# Patient Record
Sex: Female | Born: 1985
Health system: Southern US, Community
[De-identification: ages and names within clinical notes are randomized; demographics above are authoritative.]

---

## 2015-11-28 NOTE — L&D Delivery Note (Signed)
Delivery Note At 9:00 PM a viable female was delivered via  (Presentation: ;DOA  ). Tight nuchal cord clamped and cut on perineum.  APGAR: , ; weight  .   Placenta status: spontaneous/ intact, .  Cord: 3VC, marginal insertion  with the following complications: .  Cord pH: not indicated  Anesthesia:  epidural Episiotomy:  none Lacerations:  1st degree periurethral on L Suture Repair: n/a Est. Blood Loss (mL):  400  Mom to postpartum.  Baby to Couplet care / Skin to Skin.  Dashel Goines A. 03/29/2016, 9:12 PM

## 2015-12-09 DIAGNOSIS — Z3482 Encounter for supervision of other normal pregnancy, second trimester: Secondary | ICD-10-CM | POA: Diagnosis not present

## 2015-12-09 LAB — OB RESULTS CONSOLE ANTIBODY SCREEN: Antibody Screen: NEGATIVE

## 2015-12-09 LAB — OB RESULTS CONSOLE RUBELLA ANTIBODY, IGM: Rubella: IMMUNE

## 2015-12-09 LAB — OB RESULTS CONSOLE ABO/RH: RH Type: POSITIVE

## 2015-12-09 LAB — OB RESULTS CONSOLE HEPATITIS B SURFACE ANTIGEN: Hepatitis B Surface Ag: NEGATIVE

## 2015-12-09 LAB — OB RESULTS CONSOLE RPR: RPR: NONREACTIVE

## 2015-12-17 DIAGNOSIS — Z3A25 25 weeks gestation of pregnancy: Secondary | ICD-10-CM | POA: Diagnosis not present

## 2015-12-17 DIAGNOSIS — O09219 Supervision of pregnancy with history of pre-term labor, unspecified trimester: Secondary | ICD-10-CM | POA: Diagnosis not present

## 2015-12-17 DIAGNOSIS — Z113 Encounter for screening for infections with a predominantly sexual mode of transmission: Secondary | ICD-10-CM | POA: Diagnosis not present

## 2015-12-17 DIAGNOSIS — O358XX0 Maternal care for other (suspected) fetal abnormality and damage, not applicable or unspecified: Secondary | ICD-10-CM | POA: Diagnosis not present

## 2015-12-17 LAB — OB RESULTS CONSOLE GC/CHLAMYDIA
Chlamydia: NEGATIVE
Gonorrhea: NEGATIVE

## 2015-12-30 DIAGNOSIS — Z23 Encounter for immunization: Secondary | ICD-10-CM | POA: Diagnosis not present

## 2015-12-30 DIAGNOSIS — Z36 Encounter for antenatal screening of mother: Secondary | ICD-10-CM | POA: Diagnosis not present

## 2015-12-30 DIAGNOSIS — O09219 Supervision of pregnancy with history of pre-term labor, unspecified trimester: Secondary | ICD-10-CM | POA: Diagnosis not present

## 2015-12-30 LAB — OB RESULTS CONSOLE HIV ANTIBODY (ROUTINE TESTING): HIV: NONREACTIVE

## 2016-01-07 DIAGNOSIS — O09213 Supervision of pregnancy with history of pre-term labor, third trimester: Secondary | ICD-10-CM | POA: Diagnosis not present

## 2016-01-07 DIAGNOSIS — Z3A28 28 weeks gestation of pregnancy: Secondary | ICD-10-CM | POA: Diagnosis not present

## 2016-01-07 DIAGNOSIS — O09219 Supervision of pregnancy with history of pre-term labor, unspecified trimester: Secondary | ICD-10-CM | POA: Diagnosis not present

## 2016-02-28 DIAGNOSIS — O09219 Supervision of pregnancy with history of pre-term labor, unspecified trimester: Secondary | ICD-10-CM | POA: Diagnosis not present

## 2016-02-28 DIAGNOSIS — Z3A36 36 weeks gestation of pregnancy: Secondary | ICD-10-CM | POA: Diagnosis not present

## 2016-02-28 DIAGNOSIS — O09213 Supervision of pregnancy with history of pre-term labor, third trimester: Secondary | ICD-10-CM | POA: Diagnosis not present

## 2016-03-07 DIAGNOSIS — Z3A36 36 weeks gestation of pregnancy: Secondary | ICD-10-CM | POA: Diagnosis not present

## 2016-03-07 DIAGNOSIS — O09213 Supervision of pregnancy with history of pre-term labor, third trimester: Secondary | ICD-10-CM | POA: Diagnosis not present

## 2016-03-13 LAB — OB RESULTS CONSOLE GBS: GBS: NEGATIVE

## 2016-03-29 ENCOUNTER — Inpatient Hospital Stay (HOSPITAL_COMMUNITY)
Admission: AD | Admit: 2016-03-29 | Discharge: 2016-03-30 | DRG: 775 | Disposition: A | Discharge status: Home / Self Care | Payer: 59 | Source: Ambulatory Visit | Attending: Obstetrics | Admitting: Obstetrics

## 2016-03-29 ENCOUNTER — Encounter (HOSPITAL_COMMUNITY): Payer: Self-pay

## 2016-03-29 ENCOUNTER — Other Ambulatory Visit: Payer: Self-pay | Admitting: Obstetrics & Gynecology

## 2016-03-29 ENCOUNTER — Inpatient Hospital Stay (HOSPITAL_COMMUNITY): Payer: 59 | Admitting: Anesthesiology

## 2016-03-29 ENCOUNTER — Encounter (HOSPITAL_COMMUNITY): Payer: Self-pay | Admitting: *Deleted

## 2016-03-29 ENCOUNTER — Telehealth (HOSPITAL_COMMUNITY): Payer: Self-pay | Admitting: *Deleted

## 2016-03-29 DIAGNOSIS — O09213 Supervision of pregnancy with history of pre-term labor, third trimester: Secondary | ICD-10-CM | POA: Diagnosis not present

## 2016-03-29 DIAGNOSIS — Z23 Encounter for immunization: Secondary | ICD-10-CM | POA: Diagnosis not present

## 2016-03-29 DIAGNOSIS — IMO0001 Reserved for inherently not codable concepts without codable children: Secondary | ICD-10-CM

## 2016-03-29 DIAGNOSIS — Z3A40 40 weeks gestation of pregnancy: Secondary | ICD-10-CM | POA: Diagnosis not present

## 2016-03-29 LAB — CBC
HCT: 37.1 % (ref 36.0–46.0)
Hemoglobin: 12.8 g/dL (ref 12.0–15.0)
MCH: 30.3 pg (ref 26.0–34.0)
MCHC: 34.5 g/dL (ref 30.0–36.0)
MCV: 87.9 fL (ref 78.0–100.0)
Platelets: 254 10*3/uL (ref 150–400)
RBC: 4.22 MIL/uL (ref 3.87–5.11)
RDW: 14 % (ref 11.5–15.5)
WBC: 10.2 10*3/uL (ref 4.0–10.5)

## 2016-03-29 LAB — TYPE AND SCREEN
ABO/RH(D): AB POS
Antibody Screen: NEGATIVE

## 2016-03-29 LAB — ABO/RH: ABO/RH(D): AB POS

## 2016-03-29 MED ORDER — ONDANSETRON HCL 4 MG/2ML IJ SOLN: 4.0000 mg | Freq: Four times a day (QID) | INTRAMUSCULAR | Status: DC | PRN

## 2016-03-29 MED ORDER — LIDOCAINE HCL (PF) 1 % IJ SOLN
30.0000 mL | INTRAMUSCULAR | Status: DC | PRN
  Filled 2016-03-29: qty 30

## 2016-03-29 MED ORDER — ACETAMINOPHEN 325 MG PO TABS: 650.0000 mg | ORAL_TABLET | ORAL | Status: DC | PRN

## 2016-03-29 MED ORDER — OXYTOCIN BOLUS FROM INFUSION
500.0000 mL | INTRAVENOUS | Status: DC
  Administered 2016-03-29: 500 mL via INTRAVENOUS

## 2016-03-29 MED ORDER — LACTATED RINGERS IV SOLN
INTRAVENOUS | Status: DC
  Administered 2016-03-29: 20:00:00 via INTRAVENOUS
  Administered 2016-03-29: 125 mL/h via INTRAVENOUS

## 2016-03-29 MED ORDER — OXYCODONE-ACETAMINOPHEN 5-325 MG PO TABS: 2.0000 | ORAL_TABLET | ORAL | Status: DC | PRN

## 2016-03-29 MED ORDER — EPHEDRINE 5 MG/ML INJ
10.0000 mg | INTRAVENOUS | Status: DC | PRN
  Filled 2016-03-29: qty 2

## 2016-03-29 MED ORDER — PHENYLEPHRINE 40 MCG/ML (10ML) SYRINGE FOR IV PUSH (FOR BLOOD PRESSURE SUPPORT)
80.0000 ug | PREFILLED_SYRINGE | INTRAVENOUS | Status: DC | PRN
  Filled 2016-03-29: qty 5
  Filled 2016-03-29: qty 10

## 2016-03-29 MED ORDER — OXYTOCIN 10 UNIT/ML IJ SOLN
2.5000 [IU]/h | INTRAVENOUS | Status: DC
  Filled 2016-03-29: qty 4

## 2016-03-29 MED ORDER — FENTANYL 2.5 MCG/ML BUPIVACAINE 1/10 % EPIDURAL INFUSION (WH - ANES)
14.0000 mL/h | INTRAMUSCULAR | Status: DC | PRN
  Administered 2016-03-29: 14 mL/h via EPIDURAL
  Filled 2016-03-29: qty 125

## 2016-03-29 MED ORDER — LACTATED RINGERS IV SOLN: 500.0000 mL | Freq: Once | INTRAVENOUS | Status: DC

## 2016-03-29 MED ORDER — PHENYLEPHRINE 40 MCG/ML (10ML) SYRINGE FOR IV PUSH (FOR BLOOD PRESSURE SUPPORT)
80.0000 ug | PREFILLED_SYRINGE | INTRAVENOUS | Status: DC | PRN
  Filled 2016-03-29: qty 5

## 2016-03-29 MED ORDER — LIDOCAINE HCL (PF) 1 % IJ SOLN
INTRAMUSCULAR | Status: DC | PRN
  Administered 2016-03-29 (×2): 4 mL via EPIDURAL

## 2016-03-29 MED ORDER — DIPHENHYDRAMINE HCL 50 MG/ML IJ SOLN: 12.5000 mg | INTRAMUSCULAR | Status: DC | PRN

## 2016-03-29 MED ORDER — OXYCODONE-ACETAMINOPHEN 5-325 MG PO TABS: 1.0000 | ORAL_TABLET | ORAL | Status: DC | PRN

## 2016-03-29 MED ORDER — FLEET ENEMA 7-19 GM/118ML RE ENEM: 1.0000 | ENEMA | RECTAL | Status: DC | PRN

## 2016-03-29 MED ORDER — CITRIC ACID-SODIUM CITRATE 334-500 MG/5ML PO SOLN: 30.0000 mL | ORAL | Status: DC | PRN

## 2016-03-29 MED ORDER — LACTATED RINGERS IV SOLN
500.0000 mL | INTRAVENOUS | Status: DC | PRN
Start: 2016-03-29 — End: 2016-03-30
  Administered 2016-03-29 (×2): 500 mL via INTRAVENOUS

## 2016-03-29 NOTE — H&P (Signed)
Eual Finesia M Merlo is a 30 y.o. [id number] at 7447w3d presenting for active labor. Pt notes onset contractions after membranes stripped in office at 11 AM today . Good fetal movement, No vaginal bleeding, not leaking fluid.  [facility] at [facility] since 24 wks - Late transfer of care at 24 weeks after moving from [place] with intermittent care prior to that due to insurance issues -Dated by 10 week ultrasound - Declined genetic screening - History of preterm delivery at 27 weeks due to persistent subchorionic hemorrhage patient declined 17 hydroxyprogesterone in this pregnancy also history of 37 week spontaneous vaginal delivery 10 years ago - Fetal assessment: Growth at 40+ weeks on May 3: 9 pounds, 85th percentile, AFI 17   Prenatal Transfer Tool  Maternal Diabetes: No Genetic Screening: Declined Maternal Ultrasounds/Referrals: Normal Fetal Ultrasounds or other Referrals:  None Maternal Substance Abuse:  No Significant Maternal Medications:  None Significant Maternal Lab Results: None     OB History    Gravida Para Term Preterm AB TAB SAB Ectopic Multiple Living   3 2 1 1      2      History reviewed. No pertinent past medical history. History reviewed. No pertinent past surgical history. Family History: family history includes Cancer in her maternal grandmother and paternal grandmother; Hypertension in her mother. Social History:  reports that she has never smoked. She has never used smokeless tobacco. She reports that she does not drink alcohol or use illicit drugs.  Review of Systems - Negative except Contractions   Dilation: 7 Effacement (%): 80 Station: -1 Exam by:: Ernestina PennaFogleman, MD Blood pressure 112/61, pulse 85, temperature 97.4 F (36.3 C), temperature source Oral, resp. rate 18, height 5\' 3"  (1.6 m), weight 90.719 kg (200 lb), SpO2 100 %.  Physical Exam:  Gen: well appearing, no distress Back: no CVAT Abd: gravid, NT, no RUQ pain LE: No edema, equal bilaterally,  non-tender Toco: Every 3 minutes FH: baseline 135, accelerations present, no deceleratons, 10 beat variability  Prenatal labs: ABO, Rh: --/--/AB POS, AB POS (05/03 1750) Antibody: NEG (05/03 1750) Rubella: !Error! Immune RPR: Nonreactive (01/12 0000)  HBsAg: Negative (01/12 0000)  HIV:   negative GBS: Negative (04/17 0000)  1 hr Glucola 100  Genetic screening not done Anatomy US normal   Assessment/Plan: 30 y.o. [id number] at 2447w3d Patient presented in active labor. Expectant management. Planning epidural. History of vaginal delivery 2 this baby is significant only bigger than her prior pregnancies. GBS negative   Judine Arciniega A. 03/29/2016, 8:20 PM

## 2016-03-29 NOTE — Anesthesia Preprocedure Evaluation (Signed)
Anesthesia Evaluation  Patient identified by MRN, date of birth, ID band Patient awake    Reviewed: Allergy & Precautions, NPO status , Patient's Chart, lab work & pertinent test results  Airway Mallampati: II  TM Distance: >3 FB Neck ROM: Full    Dental no notable dental hx. (+) Teeth Intact   Pulmonary neg pulmonary ROS,    Pulmonary exam normal breath sounds clear to auscultation       Cardiovascular negative cardio ROS Normal cardiovascular exam Rhythm:Regular Rate:Normal     Neuro/Psych negative neurological ROS  negative psych ROS   GI/Hepatic negative GI ROS, Neg liver ROS,   Endo/Other  Obesity   Renal/GU negative Renal ROS  negative genitourinary   Musculoskeletal negative musculoskeletal ROS (+)   Abdominal (+) + obese,   Peds  Hematology negative hematology ROS (+)   Anesthesia Other Findings   Reproductive/Obstetrics (+) Pregnancy                             Anesthesia Physical Anesthesia Plan  ASA: II  Anesthesia Plan: Epidural   Post-op Pain Management:    Induction:   Airway Management Planned: Natural Airway  Additional Equipment:   Intra-op Plan:   Post-operative Plan: Extubation in OR  Informed Consent: I have reviewed the patients History and Physical, chart, labs and discussed the procedure including the risks, benefits and alternatives for the proposed anesthesia with the patient or authorized representative who has indicated his/her understanding and acceptance.   Dental advisory given  Plan Discussed with: Anesthesiologist  Anesthesia Plan Comments:         Anesthesia Quick Evaluation

## 2016-03-29 NOTE — MAU Note (Signed)
Patient presents with c/o ctx a min apart. Patient denies any LOF or bleeding. Fetus active

## 2016-03-29 NOTE — Progress Notes (Signed)
S: Doing well, no complaints, pain controlled with epidural though feeling increased pressure  O: BP 112/61 mmHg  Pulse 85  Temp(Src) 97.4 F (36.3 C) (Oral)  Resp 18  Ht 5\' 3"  (1.6 m)  Wt 90.719 kg (200 lb)  BMI 35.44 kg/m2  SpO2 100%   FHT:  FHR: 140s bpm, variability: moderate,  accelerations:  Present,  decelerations:  Absent UC:   regular, every 3 minutes SVE:   Dilation: 9 Effacement (%): 100 Station: -1 Exam by:: Doloris HallJenny Middleton RN   Exam by me: 7/80/-2 SROM- thin meconium   A / P:  30 y.o.  Obstetric History   G3   P2   T1   P1   A0   TAB0   SAB0   E0   M0   L2    at 5677w3d Active labor. Expectant management. Reactive fetus, Epidural  Fetal Wellbeing:  Category I Pain Control:  Epidural  Anticipated MOD:  NSVD  Jakari Sada A. 03/29/2016, 7:50 PM

## 2016-03-29 NOTE — Anesthesia Procedure Notes (Signed)
Epidural Patient location during procedure: OB Start time: 03/29/2016 6:32 PM  Staffing Anesthesiologist: Mal AmabileFOSTER, Tremel Setters Performed by: anesthesiologist   Preanesthetic Checklist Completed: patient identified, site marked, surgical consent, pre-op evaluation, timeout performed, IV checked, risks and benefits discussed and monitors and equipment checked  Epidural Patient position: sitting Prep: site prepped and draped and DuraPrep Patient monitoring: continuous pulse ox and blood pressure Approach: midline Location: L3-L4 Injection technique: LOR air  Needle:  Needle type: Tuohy  Needle gauge: 17 G Needle length: 9 cm and 9 Needle insertion depth: 7 cm Catheter type: closed end flexible Catheter size: 19 Gauge Catheter at skin depth: 12 cm Test dose: negative and Other  Assessment Events: blood not aspirated, injection not painful, no injection resistance, negative IV test and no paresthesia  Additional Notes Patient identified. Risks and benefits discussed including failed block, incomplete  Pain control, post dural puncture headache, nerve damage, paralysis, blood pressure Changes, nausea, vomiting, reactions to medications-both toxic and allergic and post Partum back pain. All questions were answered. Patient expressed understanding and wished to proceed. Sterile technique was used throughout procedure. Epidural site was Dressed with sterile barrier dressing. No paresthesias, signs of intravascular injection Or signs of intrathecal spread were encountered.  Patient was more comfortable after the epidural was dosed. Please see RN's note for documentation of vital signs and FHR which are stable.

## 2016-03-29 NOTE — Telephone Encounter (Signed)
Preadmission screen  

## 2016-03-30 ENCOUNTER — Encounter (HOSPITAL_COMMUNITY): Payer: Self-pay | Admitting: Obstetrics and Gynecology

## 2016-03-30 LAB — RPR: RPR Ser Ql: NONREACTIVE

## 2016-03-30 MED ORDER — WITCH HAZEL-GLYCERIN EX PADS: 1.0000 "application " | MEDICATED_PAD | CUTANEOUS | Status: DC | PRN

## 2016-03-30 MED ORDER — DIBUCAINE 1 % RE OINT: 1.0000 "application " | TOPICAL_OINTMENT | RECTAL | Status: DC | PRN

## 2016-03-30 MED ORDER — DIPHENHYDRAMINE HCL 25 MG PO CAPS: 25.0000 mg | ORAL_CAPSULE | Freq: Four times a day (QID) | ORAL | Status: DC | PRN

## 2016-03-30 MED ORDER — SIMETHICONE 80 MG PO CHEW: 80.0000 mg | CHEWABLE_TABLET | ORAL | Status: DC | PRN

## 2016-03-30 MED ORDER — BENZOCAINE-MENTHOL 20-0.5 % EX AERO: 1.0000 "application " | INHALATION_SPRAY | CUTANEOUS | Status: DC | PRN

## 2016-03-30 MED ORDER — COCONUT OIL OIL: 1.0000 "application " | TOPICAL_OIL | Status: DC | PRN

## 2016-03-30 MED ORDER — ONDANSETRON HCL 4 MG PO TABS: 4.0000 mg | ORAL_TABLET | ORAL | Status: DC | PRN

## 2016-03-30 MED ORDER — IBUPROFEN 600 MG PO TABS: 600.0000 mg | ORAL_TABLET | Freq: Four times a day (QID) | ORAL | Status: DC

## 2016-03-30 MED ORDER — IBUPROFEN 600 MG PO TABS
600.0000 mg | ORAL_TABLET | Freq: Four times a day (QID) | ORAL | Status: DC
  Administered 2016-03-30 (×3): 600 mg via ORAL
  Filled 2016-03-30 (×3): qty 1

## 2016-03-30 MED ORDER — ZOLPIDEM TARTRATE 5 MG PO TABS: 5.0000 mg | ORAL_TABLET | Freq: Every evening | ORAL | Status: DC | PRN

## 2016-03-30 MED ORDER — SENNOSIDES-DOCUSATE SODIUM 8.6-50 MG PO TABS
2.0000 | ORAL_TABLET | ORAL | Status: DC
  Administered 2016-03-30: 2 via ORAL
  Filled 2016-03-30: qty 2

## 2016-03-30 MED ORDER — PRENATAL MULTIVITAMIN CH
1.0000 | ORAL_TABLET | Freq: Every day | ORAL | Status: DC
  Administered 2016-03-30: 1 via ORAL
  Filled 2016-03-30: qty 1

## 2016-03-30 MED ORDER — ACETAMINOPHEN 325 MG PO TABS: 650.0000 mg | ORAL_TABLET | ORAL | Status: DC | PRN

## 2016-03-30 MED ORDER — TETANUS-DIPHTH-ACELL PERTUSSIS 5-2.5-18.5 LF-MCG/0.5 IM SUSP
0.5000 mL | Freq: Once | INTRAMUSCULAR | Status: DC
Start: 2016-03-30 — End: 2016-03-30

## 2016-03-30 MED ORDER — ONDANSETRON HCL 4 MG/2ML IJ SOLN: 4.0000 mg | INTRAMUSCULAR | Status: DC | PRN

## 2016-03-30 NOTE — Discharge Summary (Signed)
OB Discharge Summary     Patient Name: Sherri Logan DOB: 1986/05/16 MRN: [id number]  Date of admission: 03/29/2016 Delivering MD: Noland Fordyce   Date of discharge: 03/30/2016  Admitting diagnosis: 40.3d labor Intrauterine pregnancy: [redacted]w[redacted]d     Secondary diagnosis:  Principal Problem:   Postpartum care following vaginal delivery (5/3) Active Problems:   Active labor at term  Additional problems: none     Discharge diagnosis: Term Pregnancy Delivered                                                                                                Post partum procedures:none  Augmentation: none  Complications: None  Hospital course:  Onset of Labor With Vaginal Delivery     30 y.o. yo [id number] at [redacted]w[redacted]d was admitted in Active Labor on 03/29/2016. Patient had an uncomplicated labor course as follows:  Membrane Rupture Time/Date: 7:42 PM ,03/29/2016   Intrapartum Procedures: Episiotomy: None [1]                                         Lacerations:  None [1]  Patient had a delivery of a Viable infant. 03/29/2016  Information for the patient's newborn:  Chelbie, Jarnagin Girl Selinda [[id number]]  Delivery Method: Vaginal, Spontaneous Delivery (Filed from Delivery Summary)    Pateint had an uncomplicated postpartum course.  She is ambulating, tolerating a regular diet, passing flatus, and urinating well. Patient is discharged home in stable condition on 03/30/2016.    Physical exam  Filed Vitals:   03/29/16 2216 03/29/16 2303 03/30/16 0047 03/30/16 0415  BP: 124/71 96/77 120/68 107/67  Pulse: 91 83 79 75  Temp:  98.3 F (36.8 C) 97.9 F (36.6 C) 97.7 F (36.5 C)  TempSrc:      Resp: 16     Height:      Weight:      SpO2:    99%   General: alert, cooperative and no distress Lochia: appropriate Uterine Fundus: firm, midline, U-1 DVT Evaluation: No evidence of DVT seen on physical exam. Negative Homan's sign. No cords or calf tenderness. No significant calf/ankle edema. Labs: Lab Results   Component Value Date   WBC 10.2 03/29/2016   HGB 12.8 03/29/2016   HCT 37.1 03/29/2016   MCV 87.9 03/29/2016   PLT 254 03/29/2016   No flowsheet data found.  Discharge instruction: per After Visit Summary and "Baby and Me Booklet".  After visit meds:    Medication List    TAKE these medications        ibuprofen 600 MG tablet  Commonly known as:  ADVIL,MOTRIN  Take 1 tablet (600 mg total) by mouth every 6 (six) hours.     prenatal multivitamin Tabs tablet  Take 1 tablet by mouth daily at 12 noon.        Diet: routine diet  Activity: Advance as tolerated. Pelvic rest for 6 weeks.   Outpatient follow up:6 weeks Follow up Appt:No future appointments. Follow up Visit:No Follow-up on  file.  Postpartum contraception: Undecided  Newborn Data: Live born female  Birth Weight: 7 lb 15 oz (3600 g) APGAR: 9, 9  Baby Feeding: Breast Disposition:home with mother   03/30/2016 Raelyn MoraAWSON, Khylee Algeo, Judie PetitM, CNM

## 2016-03-30 NOTE — Anesthesia Postprocedure Evaluation (Signed)
Anesthesia Post Note  Patient: Sherri Logan  Procedure(s) Performed: * No procedures listed *  Patient location during evaluation: Mother Baby Anesthesia Type: Epidural Level of consciousness: awake and alert Pain management: satisfactory to patient Vital Signs Assessment: post-procedure vital signs reviewed and stable Respiratory status: respiratory function stable Cardiovascular status: stable Postop Assessment: no headache, no backache, epidural receding, patient able to bend at knees, no signs of nausea or vomiting and adequate PO intake Anesthetic complications: no Comments: Comfort level was assessed by AnesthesiaTeam and the patient was pleased with the care, interventions, and services provided by the Department of Anesthesia.     Last Vitals:  Filed Vitals:   03/30/16 0047 03/30/16 0415  BP: 120/68 107/67  Pulse: 79 75  Temp: 36.6 C 36.5 C  Resp:      Last Pain:  Filed Vitals:   03/30/16 0539  PainSc: 0-No pain   Pain Goal: Patients Stated Pain Goal: 0 (03/29/16 1718)               Aiyla Baucom

## 2016-03-30 NOTE — Progress Notes (Signed)
Patient ID: Sherri Logan, female   DOB: 1985/12/14, 30 y.o.   MRN: [id number] PPD # 1 SVD  S:  Reports feeling well. Desires an early discharge home.             Tolerating po/ No nausea or vomiting             Bleeding is light             Pain controlled with ibuprofen (OTC)             Up ad lib / ambulatory / voiding without difficulties    Newborn  Information for the patient's newborn:  Sherri Logan, Sherri Sherri Logan [[id number]][[id number]]  female  breast feeding    O:  A & O x 3, in no apparent distress              VS:  Filed Vitals:   03/29/16 2216 03/29/16 2303 03/30/16 0047 03/30/16 0415  BP: 124/71 96/77 120/68 107/67  Pulse: 91 83 79 75  Temp:  98.3 F (36.8 C) 97.9 F (36.6 C) 97.7 F (36.5 C)  TempSrc:      Resp: 16     Height:      Weight:      SpO2:    99%    LABS:  Recent Labs  03/29/16 1750  WBC 10.2  HGB 12.8  HCT 37.1  PLT 254    Blood type: AB POS (05/03 1750)  Rubella: Immune (01/12 0000)   I&O: I/O last 3 completed shifts: In: -  Out: 700 [Urine:650; Blood:50]             Lungs: Clear and unlabored  Heart: regular rate and rhythm / no murmurs  Abdomen: soft, non-tender, non-distended             Fundus: firm, non-tender, U-1  Perineum: 1st degree repair healing well  Lochia: minimal  Extremities: No edema, no calf pain or tenderness, No Homans    A/P: PPD # 1  30 y.o., [id number]   Principal Problem:    Postpartum care following vaginal delivery (5/3)     Doing well - stable status  Routine post partum orders  Early discharge home today    Parvin Stetzer, M, MSN, CNM 03/30/2016, 8:19 AM

## 2016-03-30 NOTE — Lactation Note (Signed)
This note was copied from a baby's chart. Lactation Consultation Note Experienced BF mom BF her now 30 yr old for 17 months until she became pregnant with this baby. Mom states this baby is wanting to stay on the breast. Mom has large pendulum breast. Moms nipples appear flat until stimulated, erects well, breast very compressable. Mom demonstrated hand expression w/glistening of colostrum. Mom encouraged to feed baby 8-12 times/24 hours and with feeding cues. Referred to Baby and Me Book in Breastfeeding section Pg. 22-23 for position options and Proper latch demonstration. WH/LC brochure given w/resources, support groups and LC services. Patient Name: Sherri Logan [id number]'WToday's Date: 03/30/2016 Reason for consult: Initial assessment   Maternal Data Has patient been taught Hand Expression?: Yes Does the patient have breastfeeding experience prior to this delivery?: Yes  Feeding Feeding Type: Breast Fed Length of feed: 45 min  LATCH Score/Interventions Latch: Grasps breast easily, tongue down, lips flanged, rhythmical sucking.  Audible Swallowing: None  Type of Nipple: Everted at rest and after stimulation  Comfort (Breast/Nipple): Soft / non-tender     Hold (Positioning): No assistance needed to correctly position infant at breast. Intervention(s): Breastfeeding basics reviewed;Support Pillows;Position options;Skin to skin  LATCH Score: 8  Lactation Tools Discussed/Used WIC Program: No   Consult Status Consult Status: Follow-up Date: 03/31/16 Follow-up type: In-patient    Charyl DancerCARVER, Janat Tabbert G 03/30/2016, 3:51 AM

## 2016-03-30 NOTE — Discharge Instructions (Signed)
Breast Pumping Tips °If you are breastfeeding, there may be times when you cannot feed your baby directly. Returning to work or going on a trip are common examples. Pumping allows you to store breast milk and feed it to your baby later.  °You may not get much milk when you first start to pump. Your breasts should start to make more after a few days. If you pump at the times you usually feed your baby, you may be able to keep making enough milk to feed your baby without also using formula. The more often you pump, the more milk you will produce.  °WHEN SHOULD I PUMP?  °· You can begin to pump soon after delivery. However, some experts recommend waiting about 4 weeks before giving your infant a bottle to make sure breastfeeding is going well.  °· If you plan to return to work, begin pumping a few weeks before. This will help you develop techniques that work best for you. It also lets you build up a supply of breast milk.   °· When you are with your infant, feed on demand and pump after each feeding.   °· When you are away from your infant for several hours, pump for about 15 minutes every 2-3 hours. Pump both breasts at the same time if you can.   °· If your infant has a formula feeding, make sure to pump around the same time.     °· If you drink any alcohol, wait 2 hours before pumping.   °HOW DO I PREPARE TO PUMP? °Your let-down reflex is the natural reaction to stimulation that makes your breast milk flow. It is easier to stimulate this reflex when you are relaxed. Find relaxation techniques that work for you. If you have difficulty with your let-down reflex, try these methods:  °· Smell one of your infant's blankets or an item of clothing.   °· Look at a picture or video of your infant.   °· Sit in a quiet, private space.   °· Massage the breast you plan to pump.   °· Place soothing warmth on the breast.   °· Play relaxing music.   °WHAT ARE SOME GENERAL BREAST PUMPING TIPS? °· Wash your hands before you pump. You  do not need to wash your nipples or breasts. °· There are three ways to pump. °¨ You can use your hand to massage and compress your breast. °¨ You can use a handheld manual pump. °¨ You can use an electric pump.   °· Make sure the suction cup (flange) on the breast pump is the right size. Place the flange directly over the nipple. If it is the wrong size or placed the wrong way, it may be painful and cause nipple damage.   °· If pumping is uncomfortable, apply a small amount of purified or modified lanolin to your nipple and areola. °· If you are using an electric pump, adjust the speed and suction power to be more comfortable. °· If pumping is painful or if you are not getting very much milk, you may need a different type of pump. A lactation consultant can help you determine what type of pump to use.   °· Keep a full water bottle near you at all times. Drinking lots of fluid helps you make more milk.  °· You can store your milk to use later. Pumped breast milk can be stored in a sealable, sterile container or plastic bag. Label all stored breast milk with the date you pumped it. °¨ Milk can stay out at room temperature for up to 8 hours. °¨   You can store your milk in the refrigerator for up to 8 days. °¨ You can store your milk in the freezer for 3 months. Thaw frozen milk using warm water. Do not put it in the microwave. °· Do not smoke. Smoking can lower your milk supply and harm your infant. If you need help quitting, ask your health care provider to recommend a program.   °WHEN SHOULD I CALL MY HEALTH CARE PROVIDER OR A LACTATION CONSULTANT? °· You are having trouble pumping. °· You are concerned that you are not making enough milk. °· You have nipple pain, soreness, or redness. °· You want to use birth control. Birth control pills may lower your milk supply. Talk to your health care provider about your options. °  °This information is not intended to replace advice given to you by your health care provider.  Make sure you discuss any questions you have with your health care provider. °  °Document Released: 05/03/2010 Document Revised: 11/18/2013 Document Reviewed: 09/05/2013 °[employer]vier Interactive Patient Education ©2016 [employer] °Postpartum Depression and Baby Blues °The postpartum period begins right after the birth of a baby. During this time, there is often a great amount of joy and excitement. It is also a time of many changes in the life of the parents. Regardless of how many times a mother gives birth, each child brings new challenges and dynamics to the family. It is not unusual to have feelings of excitement along with confusing shifts in moods, emotions, and thoughts. All mothers are at risk of developing postpartum depression or the "baby blues." These mood changes can occur right after giving birth, or they may occur many months after giving birth. The baby blues or postpartum depression can be mild or severe. Additionally, postpartum depression can go away rather quickly, or it can be a long-term condition.  °CAUSES °Raised hormone levels and the rapid drop in those levels are thought to be a main cause of postpartum depression and the baby blues. A number of hormones change during and after pregnancy. Estrogen and progesterone usually decrease right after the delivery of your baby. The levels of thyroid hormone and various cortisol steroids also rapidly drop. Other factors that play a role in these mood changes include major life events and genetics.  °RISK FACTORS °If you have any of the following risks for the baby blues or postpartum depression, know what symptoms to watch out for during the postpartum period. Risk factors that may increase the likelihood of getting the baby blues or postpartum depression include: °· Having a personal or family history of depression.   °· Having depression while being pregnant.   °· Having premenstrual mood issues or mood issues related to oral  contraceptives. °· Having a lot of life stress.   °· Having marital conflict.   °· Lacking a social support network.   °· Having a baby with special needs.   °· Having health problems, such as diabetes.   °SIGNS AND SYMPTOMS °Symptoms of baby blues include: °· Brief changes in mood, such as going from extreme happiness to sadness. °· Decreased concentration.   °· Difficulty sleeping.   °· Crying spells, tearfulness.   °· Irritability.   °· Anxiety.   °Symptoms of postpartum depression typically begin within the first month after giving birth. These symptoms include: °· Difficulty sleeping or excessive sleepiness.   °· Marked weight loss.   °· Agitation.   °· Feelings of worthlessness.   °· Lack of interest in activity or food.   °Postpartum psychosis is a very serious condition and can be dangerous. Fortunately, it is   rare. Displaying any of the following symptoms is cause for immediate medical attention. Symptoms of postpartum psychosis include:  °· Hallucinations and delusions.   °· Bizarre or disorganized behavior.   °· Confusion or disorientation.   °DIAGNOSIS  °A diagnosis is made by an evaluation of your symptoms. There are no medical or lab tests that lead to a diagnosis, but there are various questionnaires that a health care provider may use to identify those with the baby blues, postpartum depression, or psychosis. Often, a screening tool called the Edinburgh Postnatal Depression Scale is used to diagnose depression in the postpartum period.  °TREATMENT °The baby blues usually goes away on its own in 1-2 weeks. Social support is often all that is needed. You will be encouraged to get adequate sleep and rest. Occasionally, you may be given medicines to help you sleep.  °Postpartum depression requires treatment because it can last several months or longer if it is not treated. Treatment may include individual or group therapy, medicine, or both to address any social, physiological, and psychological factors  that may play a role in the depression. Regular exercise, a healthy diet, rest, and social support may also be strongly recommended.  °Postpartum psychosis is more serious and needs treatment right away. Hospitalization is often needed. °HOME CARE INSTRUCTIONS °· Get as much rest as you can. Nap when the baby sleeps.   °· Exercise regularly. Some women find yoga and walking to be beneficial.   °· Eat a balanced and nourishing diet.   °· Do little things that you enjoy. Have a cup of tea, take a bubble bath, read your favorite magazine, or listen to your favorite music. °· Avoid alcohol.   °· Ask for help with household chores, cooking, grocery shopping, or running errands as needed. Do not try to do everything.   °· Talk to people close to you about how you are feeling. Get support from your partner, family members, friends, or other new moms. °· Try to stay positive in how you think. Think about the things you are grateful for.   °· Do not spend a lot of time alone.   °· Only take over-the-counter or prescription medicine as directed by your health care provider. °· Keep all your postpartum appointments.   °· Let your health care provider know if you have any concerns.   °SEEK MEDICAL CARE IF: °You are having a reaction to or problems with your medicine. °SEEK IMMEDIATE MEDICAL CARE IF: °· You have suicidal feelings.   °· You think you may harm the baby or someone else. °MAKE SURE YOU: °· Understand these instructions. °· Will watch your condition. °· Will get help right away if you are not doing well or get worse. °  °This information is not intended to replace advice given to you by your health care provider. Make sure you discuss any questions you have with your health care provider. °  °Document Released: 08/17/2004 Document Revised: 11/18/2013 Document Reviewed: 08/25/2013 °Elsevier Interactive Patient Education ©2016 [employer] °Postpartum Care After Vaginal Delivery °After you deliver your newborn  (postpartum period), the usual stay in the hospital is 24-72 hours. If there were problems with your labor or delivery, or if you have other medical problems, you might be in the hospital longer.  °While you are in the hospital, you will receive help and instructions on how to care for yourself and your newborn during the postpartum period.  °While you are in the hospital: °· Be sure to tell your nurses if you have pain or discomfort, as well as   where you feel the pain and what makes the pain worse. °· If you had an incision made near your vagina (episiotomy) or if you had some tearing during delivery, the nurses may put ice packs on your episiotomy or tear. The ice packs may help to reduce the pain and swelling. °· If you are breastfeeding, you may feel uncomfortable contractions of your uterus for a couple of weeks. This is normal. The contractions help your uterus get back to normal size. °· It is normal to have some bleeding after delivery. °¨ For the first 1-3 days after delivery, the flow is red and the amount may be similar to a period. °¨ It is common for the flow to start and stop. °¨ In the first few days, you may pass some small clots. Let your nurses know if you begin to pass large clots or your flow increases. °¨ Do not  flush blood clots down the toilet before having the nurse look at them. °¨ During the next 3-10 days after delivery, your flow should become more watery and pink or brown-tinged in color. °¨ Ten to fourteen days after delivery, your flow should be a small amount of yellowish-white discharge. °¨ The amount of your flow will decrease over the first few weeks after delivery. Your flow may stop in 6-8 weeks. Most women have had their flow stop by 12 weeks after delivery. °· You should change your sanitary pads frequently. °· Wash your hands thoroughly with soap and water for at least 20 seconds after changing pads, using the toilet, or before holding or feeding your newborn. °· You should  feel like you need to empty your bladder within the first 6-8 hours after delivery. °· In case you become weak, lightheaded, or faint, call your nurse before you get out of bed for the first time and before you take a shower for the first time. °· Within the first few days after delivery, your breasts may begin to feel tender and full. This is called engorgement. Breast tenderness usually goes away within 48-72 hours after engorgement occurs. You may also notice milk leaking from your breasts. If you are not breastfeeding, do not stimulate your breasts. Breast stimulation can make your breasts produce more milk. °· Spending as much time as possible with your newborn is very important. During this time, you and your newborn can feel close and get to know each other. Having your newborn stay in your room (rooming in) will help to strengthen the bond with your newborn.  It will give you time to get to know your newborn and become comfortable caring for your newborn. °· Your hormones change after delivery. Sometimes the hormone changes can temporarily cause you to feel sad or tearful. These feelings should not last more than a few days. If these feelings last longer than that, you should talk to your caregiver. °· If desired, talk to your caregiver about methods of family planning or contraception. °· Talk to your caregiver about immunizations. Your caregiver may want you to have the following immunizations before leaving the hospital: °¨ Tetanus, diphtheria, and pertussis (Tdap) or tetanus and diphtheria (Td) immunization. It is very important that you and your family (including grandparents) or others caring for your newborn are up-to-date with the Tdap or Td immunizations. The Tdap or Td immunization can help protect your newborn from getting ill. °¨ Rubella immunization. °¨ Varicella (chickenpox) immunization. °¨ Influenza immunization. You should receive this annual immunization if you did not receive the    immunization during your pregnancy. °  °This information is not intended to replace advice given to you by your health care provider. Make sure you discuss any questions you have with your health care provider. °  °Document Released: 09/10/2007 Document Revised: 08/07/2012 Document Reviewed: 07/10/2012 °[employer]vier Interactive Patient Education ©2016 [employer] °Breastfeeding and Mastitis °Mastitis is inflammation of the breast tissue. It can occur in women who are breastfeeding. This can make breastfeeding painful. Mastitis will sometimes go away on its own. Your health care provider will help determine if treatment is needed. °CAUSES °Mastitis is often associated with a blocked milk (lactiferous) duct. This can happen when too much milk builds up in the breast. Causes of excess milk in the breast can include: °· Poor latch-on. If your baby is not latched onto the breast properly, she or he may not empty your breast completely while breastfeeding. °· Allowing too much time to pass between feedings. °· Wearing a bra or other clothing that is too tight. This puts extra pressure on the lactiferous ducts so milk does not flow through them as it should. °Mastitis can also be caused by a bacterial infection. Bacteria may enter the breast tissue through cuts or openings in the skin. In women who are breastfeeding, this may occur because of cracked or irritated skin. Cracks in the skin are often caused when your baby does not latch on properly to the breast. °SIGNS AND SYMPTOMS °· Swelling, redness, tenderness, and pain in an area of the breast. °· Swelling of the glands under the arm on the same side. °· Fever may or may not accompany mastitis. °If an infection is allowed to progress, a collection of pus (abscess) may develop. °DIAGNOSIS  °Your health care provider can usually diagnose mastitis based on your symptoms and a physical exam. Tests may be done to help confirm the diagnosis. These may include: °· Removal of pus  from the breast by applying pressure to the area. This pus can be examined in the lab to determine which bacteria are present. If an abscess has developed, the fluid in the abscess can be removed with a needle. This can also be used to confirm the diagnosis and determine the bacteria present. In most cases, pus will not be present. °· Blood tests to determine if your body is fighting a bacterial infection. °· Mammogram or ultrasound tests to rule out other problems or diseases. °TREATMENT  °Mastitis that occurs with breastfeeding will sometimes go away on its own. Your health care provider may choose to wait 24 hours after first seeing you to decide whether a prescription medicine is needed. If your symptoms are worse after 24 hours, your health care provider will likely prescribe an antibiotic medicine to treat the mastitis. He or she will determine which bacteria are most likely causing the infection and will then select an appropriate antibiotic medicine. This is sometimes changed based on the results of tests performed to identify the bacteria, or if there is no response to the antibiotic medicine selected. Antibiotic medicines are usually given by mouth. You may also be given medicine for pain. °HOME CARE INSTRUCTIONS °· Only take over-the-counter or prescription medicines for pain, fever, or discomfort as directed by your health care provider. °· If your health care provider prescribed an antibiotic medicine, take the medicine as directed. Make sure you finish it even if you start to feel better. °· Do not wear a tight or underwire bra. Wear a soft, supportive bra. °· Increase your fluid   intake, especially if you have a fever. °· Continue to empty the breast. Your health care provider can tell you whether this milk is safe for your infant or needs to be thrown out. You may be told to stop nursing until your health care provider thinks it is safe for your baby. Use a breast pump if you are advised to stop  nursing. °· Keep your nipples clean and dry. °· Empty the first breast completely before going to the other breast. If your baby is not emptying your breasts completely for some reason, use a breast pump to empty your breasts. °· If you go back to work, pump your breasts while at work to stay in time with your nursing schedule. °· Avoid allowing your breasts to become overly filled with milk (engorged). °SEEK MEDICAL CARE IF: °· You have pus-like discharge from the breast. °· Your symptoms do not improve with the treatment prescribed by your health care provider within 2 days. °SEEK IMMEDIATE MEDICAL CARE IF: °· Your pain and swelling are getting worse. °· You have pain that is not controlled with medicine. °· You have a red line extending from the breast toward your armpit. °· You have a fever or persistent symptoms for more than 2-3 days. °· You have a fever and your symptoms suddenly get worse. °MAKE SURE YOU:  °· Understand these instructions. °· Will watch your condition. °· Will get help right away if you are not doing well or get worse. °  °This information is not intended to replace advice given to you by your health care provider. Make sure you discuss any questions you have with your health care provider. °  °Document Released: 03/10/2005 Document Revised: 11/18/2013 Document Reviewed: 06/19/2013 °[employer]vier Interactive Patient Education ©2016 [employer] ° °Breastfeeding °Deciding to breastfeed is one of the best choices you can make for you and your baby. A change in hormones during pregnancy causes your breast tissue to grow and increases the number and size of your milk ducts. These hormones also allow proteins, sugars, and fats from your blood supply to make breast milk in your milk-producing glands. Hormones prevent breast milk from being released before your baby is born as well as prompt milk flow after birth. Once breastfeeding has begun, thoughts of your baby, as well as his or her sucking or  crying, can stimulate the release of milk from your milk-producing glands.  °BENEFITS OF BREASTFEEDING °For Your Baby °· Your first milk (colostrum) helps your baby's digestive system function better. °· There are antibodies in your milk that help your baby fight off infections. °· Your baby has a lower incidence of asthma, allergies, and sudden infant death syndrome. °· The nutrients in breast milk are better for your baby than infant formulas and are designed uniquely for your baby's needs. °· Breast milk improves your baby's brain development. °· Your baby is less likely to develop other conditions, such as childhood obesity, asthma, or type 2 diabetes mellitus. °For You °· Breastfeeding helps to create a very special bond between you and your baby. °· Breastfeeding is convenient. Breast milk is always available at the correct temperature and costs nothing. °· Breastfeeding helps to burn calories and helps you lose the weight gained during pregnancy. °· Breastfeeding makes your uterus contract to its prepregnancy size faster and slows bleeding (lochia) after you give birth.   °· Breastfeeding helps to lower your risk of developing type 2 diabetes mellitus, osteoporosis, and breast or ovarian cancer later in life. °  SIGNS THAT YOUR BABY IS HUNGRY °Early Signs of Hunger °· Increased alertness or activity. °· Stretching. °· Movement of the head from side to side. °· Movement of the head and opening of the mouth when the corner of the mouth or cheek is stroked (rooting). °· Increased sucking sounds, smacking lips, cooing, sighing, or squeaking. °· Hand-to-mouth movements. °· Increased sucking of fingers or hands. °Late Signs of Hunger °· Fussing. °· Intermittent crying. °Extreme Signs of Hunger °Signs of extreme hunger will require calming and consoling before your baby will be able to breastfeed successfully. Do not wait for the following signs of extreme hunger to occur before you initiate  breastfeeding: °· Restlessness. °· A loud, strong cry. °· Screaming. °BREASTFEEDING BASICS °Breastfeeding Initiation °· Find a comfortable place to sit or lie down, with your neck and back well supported. °· Place a pillow or rolled up blanket under your baby to bring him or her to the level of your breast (if you are seated). Nursing pillows are specially designed to help support your arms and your baby while you breastfeed. °· Make sure that your baby's abdomen is facing your abdomen. °· Gently massage your breast. With your fingertips, massage from your chest wall toward your nipple in a circular motion. This encourages milk flow. You may need to continue this action during the feeding if your milk flows slowly. °· Support your breast with 4 fingers underneath and your thumb above your nipple. Make sure your fingers are well away from your nipple and your baby's mouth. °· Stroke your baby's lips gently with your finger or nipple. °· When your baby's mouth is open wide enough, quickly bring your baby to your breast, placing your entire nipple and as much of the colored area around your nipple (areola) as possible into your baby's mouth. °¨ More areola should be visible above your baby's upper lip than below the lower lip. °¨ Your baby's tongue should be between his or her lower gum and your breast. °· Ensure that your baby's mouth is correctly positioned around your nipple (latched). Your baby's lips should create a seal on your breast and be turned out (everted). °· It is common for your baby to suck about 2-3 minutes in order to start the flow of breast milk. °Latching °Teaching your baby how to latch on to your breast properly is very important. An improper latch can cause nipple pain and decreased milk supply for you and poor weight gain in your baby. Also, if your baby is not latched onto your nipple properly, he or she may swallow some air during feeding. This can make your baby fussy. Burping your baby when  you switch breasts during the feeding can help to get rid of the air. However, teaching your baby to latch on properly is still the best way to prevent fussiness from swallowing air while breastfeeding. °Signs that your baby has successfully latched on to your nipple: °· Silent tugging or silent sucking, without causing you pain. °· Swallowing heard between every 3-4 sucks. °· Muscle movement above and in front of his or her ears while sucking. °Signs that your baby has not successfully latched on to nipple: °· Sucking sounds or smacking sounds from your baby while breastfeeding. °· Nipple pain. °If you think your baby has not latched on correctly, slip your finger into the corner of your baby's mouth to break the suction and place it between your baby's gums. Attempt breastfeeding initiation again. °Signs of Successful Breastfeeding °  Signs from your baby: °· A gradual decrease in the number of sucks or complete cessation of sucking. °· Falling asleep. °· Relaxation of his or her body. °· Retention of a small amount of milk in his or her mouth. °· Letting go of your breast by himself or herself. °Signs from you: °· Breasts that have increased in firmness, weight, and size 1-3 hours after feeding. °· Breasts that are softer immediately after breastfeeding. °· Increased milk volume, as well as a change in milk consistency and color by the fifth day of breastfeeding. °· Nipples that are not sore, cracked, or bleeding. °Signs That Your Baby is Getting Enough Milk °· Wetting at least 3 diapers in a 24-hour period. The urine should be clear and pale yellow by age 5 days. °· At least 3 stools in a 24-hour period by age 5 days. The stool should be soft and yellow. °· At least 3 stools in a 24-hour period by age 7 days. The stool should be seedy and yellow. °· No loss of weight greater than 10% of birth weight during the first 3 days of age. °· Average weight gain of 4-7 ounces (113-198 g) per week after age 4  days. °· Consistent daily weight gain by age 5 days, without weight loss after the age of 2 weeks. °After a feeding, your baby may spit up a small amount. This is common. °BREASTFEEDING FREQUENCY AND DURATION °Frequent feeding will help you make more milk and can prevent sore nipples and breast engorgement. Breastfeed when you feel the need to reduce the fullness of your breasts or when your baby shows signs of hunger. This is called "breastfeeding on demand." Avoid introducing a pacifier to your baby while you are working to establish breastfeeding (the first 4-6 weeks after your baby is born). After this time you may choose to use a pacifier. Research has shown that pacifier use during the first year of a baby's life decreases the risk of sudden infant death syndrome (SIDS). °Allow your baby to feed on each breast as long as he or she wants. Breastfeed until your baby is finished feeding. When your baby unlatches or falls asleep while feeding from the first breast, offer the second breast. Because newborns are often sleepy in the first few weeks of life, you may need to awaken your baby to get him or her to feed. °Breastfeeding times will vary from baby to baby. However, the following rules can serve as a guide to help you ensure that your baby is properly fed: °· Newborns (babies 4 weeks of age or younger) may breastfeed every 1-3 hours. °· Newborns should not go longer than 3 hours during the day or 5 hours during the night without breastfeeding. °· You should breastfeed your baby a minimum of 8 times in a 24-hour period until you begin to introduce solid foods to your baby at around 6 months of age. °BREAST MILK PUMPING °Pumping and storing breast milk allows you to ensure that your baby is exclusively fed your breast milk, even at times when you are unable to breastfeed. This is especially important if you are going back to work while you are still breastfeeding or when you are not able to be present during  feedings. Your lactation consultant can give you guidelines on how long it is safe to store breast milk. °A breast pump is a machine that allows you to pump milk from your breast into a sterile bottle. The pumped breast milk can   then be stored in a refrigerator or freezer. Some breast pumps are operated by hand, while others use electricity. Ask your lactation consultant which type will work best for you. Breast pumps can be purchased, but some hospitals and breastfeeding support groups lease breast pumps on a monthly basis. A lactation consultant can teach you how to hand express breast milk, if you prefer not to use a pump. °CARING FOR YOUR BREASTS WHILE YOU BREASTFEED °Nipples can become dry, cracked, and sore while breastfeeding. The following recommendations can help keep your breasts moisturized and healthy: °· Avoid using soap on your nipples. °· Wear a supportive bra. Although not required, special nursing bras and tank tops are designed to allow access to your breasts for breastfeeding without taking off your entire bra or top. Avoid wearing underwire-style bras or extremely tight bras. °· Air dry your nipples for 3-4 minutes after each feeding. °· Use only cotton bra pads to absorb leaked breast milk. Leaking of breast milk between feedings is normal. °· Use lanolin on your nipples after breastfeeding. Lanolin helps to maintain your skin's normal moisture barrier. If you use pure lanolin, you do not need to wash it off before feeding your baby again. Pure lanolin is not toxic to your baby. You may also hand express a few drops of breast milk and gently massage that milk into your nipples and allow the milk to air dry. °In the first few weeks after giving birth, some women experience extremely full breasts (engorgement). Engorgement can make your breasts feel heavy, warm, and tender to the touch. Engorgement peaks within 3-5 days after you give birth. The following recommendations can help ease  engorgement: °· Completely empty your breasts while breastfeeding or pumping. You may want to start by applying warm, moist heat (in the shower or with warm water-soaked hand towels) just before feeding or pumping. This increases circulation and helps the milk flow. If your baby does not completely empty your breasts while breastfeeding, pump any extra milk after he or she is finished. °· Wear a snug bra (nursing or regular) or tank top for 1-2 days to signal your body to slightly decrease milk production. °· Apply ice packs to your breasts, unless this is too uncomfortable for you. °· Make sure that your baby is latched on and positioned properly while breastfeeding. °If engorgement persists after 48 hours of following these recommendations, contact your health care provider or a lactation consultant. °OVERALL HEALTH CARE RECOMMENDATIONS WHILE BREASTFEEDING °· Eat healthy foods. Alternate between meals and snacks, eating 3 of each per day. Because what you eat affects your breast milk, some of the foods may make your baby more irritable than usual. Avoid eating these foods if you are sure that they are negatively affecting your baby. °· Drink milk, fruit juice, and water to satisfy your thirst (about 10 glasses a day). °· Rest often, relax, and continue to take your prenatal vitamins to prevent fatigue, stress, and anemia. °· Continue breast self-awareness checks. °· Avoid chewing and smoking tobacco. Chemicals from cigarettes that pass into breast milk and exposure to secondhand smoke may harm your baby. °· Avoid alcohol and drug use, including marijuana. °Some medicines that may be harmful to your baby can pass through breast milk. It is important to ask your health care provider before taking any medicine, including all over-the-counter and prescription medicine as well as vitamin and herbal supplements. °It is possible to become pregnant while breastfeeding. If birth control is desired, ask your health care    provider about options that will be safe for your baby. °SEEK MEDICAL CARE IF: °· You feel like you want to stop breastfeeding or have become frustrated with breastfeeding. °· You have painful breasts or nipples. °· Your nipples are cracked or bleeding. °· Your breasts are red, tender, or warm. °· You have a swollen area on either breast. °· You have a fever or chills. °· You have nausea or vomiting. °· You have drainage other than breast milk from your nipples. °· Your breasts do not become full before feedings by the fifth day after you give birth. °· You feel sad and depressed. °· Your baby is too sleepy to eat well. °· Your baby is having trouble sleeping.   °· Your baby is wetting less than 3 diapers in a 24-hour period. °· Your baby has less than 3 stools in a 24-hour period. °· Your baby's skin or the white part of his or her eyes becomes yellow.   °· Your baby is not gaining weight by 5 days of age. °SEEK IMMEDIATE MEDICAL CARE IF: °· Your baby is overly tired (lethargic) and does not want to wake up and feed. °· Your baby develops an unexplained fever. °  °This information is not intended to replace advice given to you by your health care provider. Make sure you discuss any questions you have with your health care provider. °  °Document Released: 11/13/2005 Document Revised: 08/04/2015 Document Reviewed: 05/07/2013 °Elsevier Interactive Patient Education ©2016 [employer] ° °

## 2016-03-31 ENCOUNTER — Ambulatory Visit: Payer: Self-pay

## 2016-03-31 NOTE — Lactation Note (Signed)
This note was copied from a baby's chart. Lactation Consultation Note  Patient Name: Sherri Logan OZHYQ'MToday's Date: 03/31/2016 Reason for consult: Follow-up assessment;Other (Comment) (5% weight loss , at 27 hours bili check - 5.2 )  Baby is 36 hours old and feeding consistently according to the doc flow sheets and per mom . Per mom right nipple aliitle tender but not broken down , LC encouraged applying EBM , and if needed coconut oil.  Sore nipple and engorgement prevention and tx reviewed.  Mom has a hand pump for D/C   Mother informed of post-discharge support and given phone number to the lactation department, including services for phone call assistance; out-patient appointments; and breastfeeding support group. List of other breastfeeding resources in the community given in the handout. Encouraged mother to call for problems or concerns related to breastfeeding.   Maternal Data    Feeding Feeding Type:  (baby jsut finished feeding , per mom baby has been cluster feeding ) Length of feed: 75 min (per mom )  LATCH Score/Interventions                Intervention(s): Breastfeeding basics reviewed     Lactation Tools Discussed/Used     Consult Status Consult Status: Complete Date: 03/31/16    Kathrin Greathouseorio, Josephanthony Tindel Ann 03/31/2016, 9:25 AM

## 2016-04-02 ENCOUNTER — Inpatient Hospital Stay (HOSPITAL_COMMUNITY): Admission: RE | Admit: 2016-04-02 | Payer: 59 | Source: Ambulatory Visit

## 2016-04-21 DIAGNOSIS — B373 Candidiasis of vulva and vagina: Secondary | ICD-10-CM | POA: Diagnosis not present

## 2016-05-22 DIAGNOSIS — Z30430 Encounter for insertion of intrauterine contraceptive device: Secondary | ICD-10-CM | POA: Diagnosis not present

## 2016-05-22 DIAGNOSIS — Z1151 Encounter for screening for human papillomavirus (HPV): Secondary | ICD-10-CM | POA: Diagnosis not present

## 2016-06-20 DIAGNOSIS — Z30431 Encounter for routine checking of intrauterine contraceptive device: Secondary | ICD-10-CM | POA: Diagnosis not present

## 2016-09-18 DIAGNOSIS — R102 Pelvic and perineal pain: Secondary | ICD-10-CM | POA: Diagnosis not present

## 2017-01-04 DIAGNOSIS — Z23 Encounter for immunization: Secondary | ICD-10-CM | POA: Diagnosis not present

## 2017-06-06 DIAGNOSIS — L659 Nonscarring hair loss, unspecified: Secondary | ICD-10-CM | POA: Diagnosis not present

## 2017-06-06 DIAGNOSIS — Z1329 Encounter for screening for other suspected endocrine disorder: Secondary | ICD-10-CM | POA: Diagnosis not present

## 2017-06-06 DIAGNOSIS — Z01419 Encounter for gynecological examination (general) (routine) without abnormal findings: Secondary | ICD-10-CM | POA: Diagnosis not present

## 2017-06-06 DIAGNOSIS — R5383 Other fatigue: Secondary | ICD-10-CM | POA: Diagnosis not present

## 2017-06-06 DIAGNOSIS — R102 Pelvic and perineal pain: Secondary | ICD-10-CM | POA: Diagnosis not present

## 2017-06-06 DIAGNOSIS — Z130 Encounter for screening for diseases of the blood and blood-forming organs and certain disorders involving the immune mechanism: Secondary | ICD-10-CM | POA: Diagnosis not present

## 2017-06-06 DIAGNOSIS — Z6832 Body mass index (BMI) 32.0-32.9, adult: Secondary | ICD-10-CM | POA: Diagnosis not present

## 2017-10-16 DIAGNOSIS — N760 Acute vaginitis: Secondary | ICD-10-CM | POA: Diagnosis not present

## 2018-02-15 DIAGNOSIS — Z13220 Encounter for screening for lipoid disorders: Secondary | ICD-10-CM | POA: Diagnosis not present

## 2018-02-15 DIAGNOSIS — Z124 Encounter for screening for malignant neoplasm of cervix: Secondary | ICD-10-CM | POA: Diagnosis not present

## 2018-02-15 DIAGNOSIS — E669 Obesity, unspecified: Secondary | ICD-10-CM | POA: Diagnosis not present

## 2018-02-15 DIAGNOSIS — Z0001 Encounter for general adult medical examination with abnormal findings: Secondary | ICD-10-CM | POA: Diagnosis not present

## 2018-02-25 DIAGNOSIS — R945 Abnormal results of liver function studies: Secondary | ICD-10-CM | POA: Diagnosis not present

## 2018-02-25 DIAGNOSIS — Z23 Encounter for immunization: Secondary | ICD-10-CM | POA: Diagnosis not present

## 2018-02-25 DIAGNOSIS — Z111 Encounter for screening for respiratory tuberculosis: Secondary | ICD-10-CM | POA: Diagnosis not present

## 2018-02-25 DIAGNOSIS — Z136 Encounter for screening for cardiovascular disorders: Secondary | ICD-10-CM | POA: Diagnosis not present

## 2018-02-25 DIAGNOSIS — Z1159 Encounter for screening for other viral diseases: Secondary | ICD-10-CM | POA: Diagnosis not present

## 2018-02-25 DIAGNOSIS — Z13220 Encounter for screening for lipoid disorders: Secondary | ICD-10-CM | POA: Diagnosis not present

## 2018-03-25 DIAGNOSIS — Z23 Encounter for immunization: Secondary | ICD-10-CM | POA: Diagnosis not present

## 2018-06-07 DIAGNOSIS — E669 Obesity, unspecified: Secondary | ICD-10-CM | POA: Diagnosis not present

## 2018-06-07 DIAGNOSIS — Z113 Encounter for screening for infections with a predominantly sexual mode of transmission: Secondary | ICD-10-CM | POA: Diagnosis not present

## 2018-06-07 DIAGNOSIS — Z0000 Encounter for general adult medical examination without abnormal findings: Secondary | ICD-10-CM | POA: Diagnosis not present

## 2018-06-07 DIAGNOSIS — Z13220 Encounter for screening for lipoid disorders: Secondary | ICD-10-CM | POA: Diagnosis not present

## 2018-06-07 DIAGNOSIS — R945 Abnormal results of liver function studies: Secondary | ICD-10-CM | POA: Diagnosis not present

## 2018-06-07 DIAGNOSIS — Z1329 Encounter for screening for other suspected endocrine disorder: Secondary | ICD-10-CM | POA: Diagnosis not present

## 2018-06-07 DIAGNOSIS — B353 Tinea pedis: Secondary | ICD-10-CM | POA: Diagnosis not present

## 2018-06-07 DIAGNOSIS — Z130 Encounter for screening for diseases of the blood and blood-forming organs and certain disorders involving the immune mechanism: Secondary | ICD-10-CM | POA: Diagnosis not present

## 2018-06-07 DIAGNOSIS — Z124 Encounter for screening for malignant neoplasm of cervix: Secondary | ICD-10-CM | POA: Diagnosis not present

## 2018-06-07 DIAGNOSIS — Z1389 Encounter for screening for other disorder: Secondary | ICD-10-CM | POA: Diagnosis not present

## 2018-06-07 DIAGNOSIS — Z114 Encounter for screening for human immunodeficiency virus [HIV]: Secondary | ICD-10-CM | POA: Diagnosis not present

## 2018-08-19 DIAGNOSIS — Z23 Encounter for immunization: Secondary | ICD-10-CM | POA: Diagnosis not present

## 2019-02-03 DIAGNOSIS — Z111 Encounter for screening for respiratory tuberculosis: Secondary | ICD-10-CM | POA: Diagnosis not present

## 2019-06-09 DIAGNOSIS — Z6832 Body mass index (BMI) 32.0-32.9, adult: Secondary | ICD-10-CM | POA: Diagnosis not present

## 2019-06-09 DIAGNOSIS — Z01419 Encounter for gynecological examination (general) (routine) without abnormal findings: Secondary | ICD-10-CM | POA: Diagnosis not present

## 2019-06-09 DIAGNOSIS — Z1151 Encounter for screening for human papillomavirus (HPV): Secondary | ICD-10-CM | POA: Diagnosis not present

## 2019-06-17 ENCOUNTER — Other Ambulatory Visit: Payer: Self-pay

## 2019-06-17 DIAGNOSIS — Z20822 Contact with and (suspected) exposure to covid-19: Secondary | ICD-10-CM

## 2019-06-19 LAB — NOVEL CORONAVIRUS, NAA: SARS-CoV-2, NAA: NOT DETECTED

## 2019-08-27 DIAGNOSIS — Z23 Encounter for immunization: Secondary | ICD-10-CM | POA: Diagnosis not present

## 2019-08-27 DIAGNOSIS — H02841 Edema of right upper eyelid: Secondary | ICD-10-CM | POA: Diagnosis not present

## 2019-12-25 ENCOUNTER — Ambulatory Visit: Payer: 59 | Attending: Internal Medicine

## 2019-12-25 DIAGNOSIS — Z20822 Contact with and (suspected) exposure to covid-19: Secondary | ICD-10-CM

## 2019-12-26 LAB — NOVEL CORONAVIRUS, NAA: SARS-CoV-2, NAA: NOT DETECTED

## 2020-04-07 DIAGNOSIS — R102 Pelvic and perineal pain: Secondary | ICD-10-CM | POA: Diagnosis not present

## 2020-06-28 DIAGNOSIS — Z03818 Encounter for observation for suspected exposure to other biological agents ruled out: Secondary | ICD-10-CM | POA: Diagnosis not present

## 2020-06-28 DIAGNOSIS — Z20822 Contact with and (suspected) exposure to covid-19: Secondary | ICD-10-CM | POA: Diagnosis not present

## 2020-07-06 ENCOUNTER — Ambulatory Visit: Payer: Self-pay | Attending: Internal Medicine

## 2020-07-06 DIAGNOSIS — Z23 Encounter for immunization: Secondary | ICD-10-CM

## 2020-07-06 NOTE — Progress Notes (Signed)
   Covid-19 Vaccination Clinic  Name:  Sherri Logan    MRN: [id number] DOB: 1986/02/13  07/06/2020  [name] was observed post Covid-19 immunization for 15 minutes without incident. She was provided with Vaccine Information Sheet and instruction to access the V-Safe system.   [name] was instructed to call 911 with any severe reactions post vaccine: Marland Kitchen Difficulty breathing  . Swelling of face and throat  . A fast heartbeat  . A bad rash all over body  . Dizziness and weakness   Immunizations Administered    Name Date Dose VIS Date Route   Pfizer COVID-19 Vaccine 07/06/2020 10:40 AM 0.3 mL 01/21/2019 Intramuscular   Manufacturer: [employer], [employer]   Lot: [id number]   NDC: [address]-5

## 2020-07-27 ENCOUNTER — Ambulatory Visit: Payer: Self-pay

## 2020-12-15 DIAGNOSIS — R519 Headache, unspecified: Secondary | ICD-10-CM | POA: Diagnosis not present

## 2020-12-15 DIAGNOSIS — R0981 Nasal congestion: Secondary | ICD-10-CM | POA: Diagnosis not present

## 2020-12-15 DIAGNOSIS — R5383 Other fatigue: Secondary | ICD-10-CM | POA: Diagnosis not present

## 2020-12-15 DIAGNOSIS — R6883 Chills (without fever): Secondary | ICD-10-CM | POA: Diagnosis not present

## 2020-12-15 DIAGNOSIS — J3489 Other specified disorders of nose and nasal sinuses: Secondary | ICD-10-CM | POA: Diagnosis not present

## 2021-03-23 DIAGNOSIS — Z5321 Procedure and treatment not carried out due to patient leaving prior to being seen by health care provider: Secondary | ICD-10-CM | POA: Diagnosis not present

## 2021-04-11 DIAGNOSIS — N760 Acute vaginitis: Secondary | ICD-10-CM | POA: Diagnosis not present

## 2021-04-11 DIAGNOSIS — N898 Other specified noninflammatory disorders of vagina: Secondary | ICD-10-CM | POA: Diagnosis not present

## 2021-05-05 DIAGNOSIS — Z6834 Body mass index (BMI) 34.0-34.9, adult: Secondary | ICD-10-CM | POA: Diagnosis not present

## 2021-05-05 DIAGNOSIS — Z01419 Encounter for gynecological examination (general) (routine) without abnormal findings: Secondary | ICD-10-CM | POA: Diagnosis not present

## 2021-05-05 DIAGNOSIS — Z30432 Encounter for removal of intrauterine contraceptive device: Secondary | ICD-10-CM | POA: Diagnosis not present

## 2021-06-21 DIAGNOSIS — Z3200 Encounter for pregnancy test, result unknown: Secondary | ICD-10-CM | POA: Diagnosis not present

## 2021-06-21 DIAGNOSIS — Z3689 Encounter for other specified antenatal screening: Secondary | ICD-10-CM | POA: Diagnosis not present

## 2021-06-21 LAB — OB RESULTS CONSOLE ANTIBODY SCREEN: Antibody Screen: NEGATIVE

## 2021-07-08 DIAGNOSIS — Z3201 Encounter for pregnancy test, result positive: Secondary | ICD-10-CM | POA: Diagnosis not present

## 2021-07-13 LAB — OB RESULTS CONSOLE ABO/RH: RH Type: POSITIVE

## 2021-07-25 DIAGNOSIS — O09521 Supervision of elderly multigravida, first trimester: Secondary | ICD-10-CM | POA: Diagnosis not present

## 2021-07-25 DIAGNOSIS — Z3A10 10 weeks gestation of pregnancy: Secondary | ICD-10-CM | POA: Diagnosis not present

## 2021-07-25 DIAGNOSIS — Z3689 Encounter for other specified antenatal screening: Secondary | ICD-10-CM | POA: Diagnosis not present

## 2021-07-25 DIAGNOSIS — Z118 Encounter for screening for other infectious and parasitic diseases: Secondary | ICD-10-CM | POA: Diagnosis not present

## 2021-07-25 DIAGNOSIS — Z36 Encounter for antenatal screening for chromosomal anomalies: Secondary | ICD-10-CM | POA: Diagnosis not present

## 2021-07-25 LAB — OB RESULTS CONSOLE HIV ANTIBODY (ROUTINE TESTING): HIV: NONREACTIVE

## 2021-07-25 LAB — OB RESULTS CONSOLE VARICELLA ZOSTER ANTIBODY, IGG: Varicella: IMMUNE

## 2021-07-25 LAB — OB RESULTS CONSOLE RPR: RPR: NONREACTIVE

## 2021-07-25 LAB — OB RESULTS CONSOLE GC/CHLAMYDIA
Chlamydia: NEGATIVE
Gonorrhea: NEGATIVE

## 2021-07-25 LAB — OB RESULTS CONSOLE HEPATITIS B SURFACE ANTIGEN: Hepatitis B Surface Ag: NEGATIVE

## 2021-07-25 LAB — OB RESULTS CONSOLE RUBELLA ANTIBODY, IGM: Rubella: IMMUNE

## 2021-08-04 ENCOUNTER — Inpatient Hospital Stay (HOSPITAL_COMMUNITY)
Admission: AD | Admit: 2021-08-04 | Discharge: 2021-08-04 | Disposition: A | Discharge status: Home / Self Care | Payer: 59 | Attending: Obstetrics and Gynecology | Admitting: Obstetrics and Gynecology

## 2021-08-04 ENCOUNTER — Inpatient Hospital Stay (HOSPITAL_COMMUNITY): Payer: 59

## 2021-08-04 ENCOUNTER — Other Ambulatory Visit: Payer: Self-pay

## 2021-08-04 ENCOUNTER — Encounter (HOSPITAL_COMMUNITY): Payer: Self-pay | Admitting: Obstetrics and Gynecology

## 2021-08-04 DIAGNOSIS — R109 Unspecified abdominal pain: Secondary | ICD-10-CM

## 2021-08-04 DIAGNOSIS — O26899 Other specified pregnancy related conditions, unspecified trimester: Secondary | ICD-10-CM

## 2021-08-04 DIAGNOSIS — O209 Hemorrhage in early pregnancy, unspecified: Secondary | ICD-10-CM | POA: Diagnosis not present

## 2021-08-04 DIAGNOSIS — Z3A12 12 weeks gestation of pregnancy: Secondary | ICD-10-CM | POA: Diagnosis not present

## 2021-08-04 DIAGNOSIS — O26891 Other specified pregnancy related conditions, first trimester: Secondary | ICD-10-CM

## 2021-08-04 DIAGNOSIS — N83201 Unspecified ovarian cyst, right side: Secondary | ICD-10-CM | POA: Diagnosis not present

## 2021-08-04 DIAGNOSIS — O4690 Antepartum hemorrhage, unspecified, unspecified trimester: Secondary | ICD-10-CM

## 2021-08-04 DIAGNOSIS — O26851 Spotting complicating pregnancy, first trimester: Secondary | ICD-10-CM | POA: Diagnosis not present

## 2021-08-04 LAB — URINALYSIS, ROUTINE W REFLEX MICROSCOPIC
Bilirubin Urine: NEGATIVE
Glucose, UA: NEGATIVE mg/dL
Hgb urine dipstick: NEGATIVE
Ketones, ur: NEGATIVE mg/dL
Leukocytes,Ua: NEGATIVE
Nitrite: NEGATIVE
Protein, ur: NEGATIVE mg/dL
Specific Gravity, Urine: 1.016 (ref 1.005–1.030)
pH: 7 (ref 5.0–8.0)

## 2021-08-04 LAB — TYPE AND SCREEN
ABO/RH(D): AB POS
Antibody Screen: NEGATIVE

## 2021-08-04 LAB — CBC
HCT: 34.1 % — ABNORMAL LOW (ref 36.0–46.0)
Hemoglobin: 11.8 g/dL — ABNORMAL LOW (ref 12.0–15.0)
MCH: 30.5 pg (ref 26.0–34.0)
MCHC: 34.6 g/dL (ref 30.0–36.0)
MCV: 88.1 fL (ref 80.0–100.0)
Platelets: 240 10*3/uL (ref 150–400)
RBC: 3.87 MIL/uL (ref 3.87–5.11)
RDW: 13.2 % (ref 11.5–15.5)
WBC: 6.1 10*3/uL (ref 4.0–10.5)
nRBC: 0 % (ref 0.0–0.2)

## 2021-08-04 NOTE — MAU Provider Note (Signed)
History     [id number]  Arrival date and time: 08/04/21 0725    Chief Complaint  Patient presents with   Nausea   Abdominal Pain     HPI Sherri Logan is a 35 y.o. at [redacted]w[redacted]d by outside [place] with PMHx notable for preterm delivery at 27 weeks, who presents for vaginal bleeding and pelvic pain.   Review of outside prenatal records from [facility] (in media tab): has had initial OB visit so far, unremarkable course to date  Patient reports she has been unwell for several days Three days prior she had an episode of vaginal bleeding like a period, no clots, as well as some abdominal cramping She attempted to contact her OBGYN office but was unable to get in touch with them and symptoms improved Earlier this morning she began to feel light headed, nauseous, and developed a more significant cramping pain in her RLQ that is radiating down her R leg She has not had something like this prior Has not taken anything for it Bleeding is somewhat similar to what happened with her pregnancy that ended in preterm delivery Denies fever, vaginal discharge  --/--/PENDING (09/08 0934)  OB History     Gravida  4   Para  3   Term  2   Preterm  1   AB      Living  3      SAB      IAB      Ectopic      Multiple  0   Live Births  3           Past Medical History:  Diagnosis Date   Postpartum care following vaginal delivery (5/3) 03/30/2016    History reviewed. No pertinent surgical history.  Family History  Problem Relation Age of Onset   Hypertension Mother    Cancer Maternal Grandmother        breast   Cancer Paternal Grandmother        breast    Social History   Socioeconomic History   Marital status: Married    Spouse name: Not on file   Number of children: Not on file   Years of education: Not on file   Highest education level: Not on file  Occupational History   Not on file  Tobacco Use   Smoking status: Never   Smokeless tobacco: Never   Substance and Sexual Activity   Alcohol use: No   Drug use: No   Sexual activity: Not on file  Other Topics Concern   Not on file  Social History Narrative   Not on file   Social Determinants of Health   Financial Resource Strain: Not on file  Food Insecurity: Not on file  Transportation Needs: Not on file  Physical Activity: Not on file  Stress: Not on file  Social Connections: Not on file  Intimate Partner Violence: Not on file    No Known Allergies  No current facility-administered medications on file prior to encounter.   Current Outpatient Medications on File Prior to Encounter  Medication Sig Dispense Refill   ibuprofen (ADVIL,MOTRIN) 600 MG tablet Take 1 tablet (600 mg total) by mouth every 6 (six) hours. 30 tablet 0   Prenatal Vit-Fe Fumarate-FA (PRENATAL MULTIVITAMIN) TABS tablet Take 1 tablet by mouth daily at 12 noon.       ROS Pertinent positives and negative per HPI, all others reviewed and negative  Physical Exam   BP 113/64 (BP Location:  Right Arm)   Pulse 70   Temp 98.4 F (36.9 C) (Oral)   Resp 16   Ht 5\' 2"  (1.575 m)   Wt 91.8 kg   SpO2 100%   BMI 37.02 kg/m   Patient Vitals for the past 24 hrs:  BP Temp Temp src Pulse Resp SpO2 Height Weight  08/04/21 1031 113/64 -- -- 70 -- 100 % -- --  08/04/21 0815 114/64 -- -- 70 -- -- -- --  08/04/21 0802 122/69 98.4 F (36.9 C) Oral 69 16 100 % -- --  08/04/21 0756 -- -- -- -- -- -- 5\' 2"  (1.575 m) 91.8 kg    Physical Exam Vitals reviewed.  Constitutional:      General: She is not in acute distress.    Appearance: She is well-developed. She is not diaphoretic.  Eyes:     General: No scleral icterus. Pulmonary:     Effort: Pulmonary effort is normal. No respiratory distress.  Abdominal:     General: There is no distension.     Palpations: Abdomen is soft.     Tenderness: There is abdominal tenderness in the left lower quadrant. There is no guarding or rebound.  Skin:    General: Skin is  warm and dry.  Neurological:     Mental Status: She is alert.     Coordination: Coordination normal.     Cervical Exam    Bedside Ultrasound Pt informed that the ultrasound is considered a limited OB ultrasound and is not intended to be a complete ultrasound exam.  Patient also informed that the ultrasound is not being completed with the intent of assessing for fetal or placental anomalies or any pelvic abnormalities.  Explained that the purpose of today's ultrasound is to assess for  viability.  Patient acknowledges the purpose of the exam and the limitations of the study.    My interpretation: IUP in cephalic presentation with vigorous somatic movements. FHR 154. Subjectively normal fluid. Bulge on anterior surface of gestational cavity c/f subchorionic hematoma. Unable to assess cervix.    Labs Results for orders placed or performed during the hospital encounter of 08/04/21 (from the past 24 hour(s))  Urinalysis, Routine w reflex microscopic Urine, Clean Catch     Status: Abnormal   Collection Time: 08/04/21  7:42 AM  Result Value Ref Range   Color, Urine YELLOW YELLOW   APPearance CLOUDY (A) CLEAR   Specific Gravity, Urine 1.016 1.005 - 1.030   pH 7.0 5.0 - 8.0   Glucose, UA NEGATIVE NEGATIVE mg/dL   Hgb urine dipstick NEGATIVE NEGATIVE   Bilirubin Urine NEGATIVE NEGATIVE   Ketones, ur NEGATIVE NEGATIVE mg/dL   Protein, ur NEGATIVE NEGATIVE mg/dL   Nitrite NEGATIVE NEGATIVE   Leukocytes,Ua NEGATIVE NEGATIVE  CBC     Status: Abnormal   Collection Time: 08/04/21  9:34 AM  Result Value Ref Range   WBC 6.1 4.0 - 10.5 K/uL   RBC 3.87 3.87 - 5.11 MIL/uL   Hemoglobin 11.8 (L) 12.0 - 15.0 g/dL   HCT 10/04/21 (L) 10/04/21 - 62.6 %   MCV 88.1 80.0 - 100.0 fL   MCH 30.5 26.0 - 34.0 pg   MCHC 34.6 30.0 - 36.0 g/dL   RDW 94.8 54.6 - 27.0 %   Platelets 240 150 - 400 K/uL   nRBC 0.0 0.0 - 0.2 %  Type and screen [facility]     Status: None (Preliminary result)   Collection  Time: 08/04/21  9:34  AM  Result Value Ref Range   ABO/RH(D) PENDING    Antibody Screen PENDING    Sample Expiration      08/07/2021,2359 Performed at [facility], [address] [place], [place] [address]     Imaging [place] Comp Less 14 Wks  Result Date: 08/04/2021 CLINICAL DATA:  Vaginal bleeding during first trimester of pregnancy, RIGHT-sided pelvic pain, spotting, assigned gestational age [redacted] weeks 0 days, G4P3 EXAM: OBSTETRIC <14 WK ULTRASOUND TECHNIQUE: Transabdominal ultrasound was performed for evaluation of the gestation as well as the maternal uterus and adnexal regions. COMPARISON:  None FINDINGS: Intrauterine gestational sac: Present, single Yolk sac:  Not identified Embryo:  Present Cardiac Activity: Present Heart Rate: 169 bpm CRL:   55.8 mm   12 w 1 d                  [place] EDC: 02/15/2022 Subchorionic hemorrhage:  None visualized. Maternal uterus/adnexae: Remainder of maternal uterus unremarkable. Developing placenta is posterior in position. RIGHT ovary measures 2.2 x 2.1 x 2.8 cm and contains a small corpus luteal cyst. LEFT ovary normal size and morphology, 1.9 x 2.4 x 1.5 cm. No free pelvic fluid or adnexal masses. IMPRESSION: Single live intrauterine gestation at 12 weeks 1 day EGA by crown-rump length. No acute abnormalities. Electronically Signed   By: Ulyses Southward M.D.   On: 08/04/2021 10:10    MAU Course  Procedures Lab Orders         Urinalysis, Routine w reflex microscopic Urine, Clean Catch         CBC    No orders of the defined types were placed in this encounter.  Imaging Orders         US OB Comp Less 14 Wks     MDM moderate  Assessment and Plan  #Vaginal bleeding, first trimester of pregnancy #Abdominal pain, first trimester of pregnancy Bedside US shows viable IUP with normal cardiac activity. Formal US obtained which shows no acute findings. On re-evaluation patient reports abdominal pain has improved without any intervention. Discussed with patient  that we do not always find an apparent cause for pain or bleeding but it is reassuring that no acute findings are apparent and abdominal exam is benign. Possibly related to advancing gestational age. Bleeding may be related to friable cervix or coitus. Regardless, 80-90% of patients with first trimester bleeding with normal IUP with cardiac activity go on to have a normal pregnancy. Reviewed warning signs in detail.  #FWB Normal FHR by [place] m-mode   Dispo: Discharged to home in stable condition.   Venora Maples, MD/MPH 08/04/21 10:33 AM  Allergies as of 08/04/2021   No Known Allergies      Medication List     STOP taking these medications    ibuprofen 600 MG tablet Commonly known as: ADVIL       TAKE these medications    prenatal multivitamin Tabs tablet Take 1 tablet by mouth daily at 12 noon.

## 2021-08-04 NOTE — MAU Note (Signed)
Sherri Logan is a 35 y.o. at [redacted]w[redacted]d here in MAU reporting: sharp right sided pain that radiates down her leg since 0700. Has also been having some nausea and chills. Had some bleeding on Monday but none since then. No discharge.  Onset of complaint: today  Pain score: 3/10  Vitals:   08/04/21 0802  BP: 122/69  Pulse: 69  Resp: 16  Temp: 98.4 F (36.9 C)  SpO2: 100%     FHT:159  Lab orders placed from triage: UA

## 2021-08-31 DIAGNOSIS — Z3A15 15 weeks gestation of pregnancy: Secondary | ICD-10-CM | POA: Diagnosis not present

## 2021-08-31 DIAGNOSIS — Z361 Encounter for antenatal screening for raised alphafetoprotein level: Secondary | ICD-10-CM | POA: Diagnosis not present

## 2021-08-31 DIAGNOSIS — O09522 Supervision of elderly multigravida, second trimester: Secondary | ICD-10-CM | POA: Diagnosis not present

## 2021-09-23 DIAGNOSIS — O43122 Velamentous insertion of umbilical cord, second trimester: Secondary | ICD-10-CM | POA: Diagnosis not present

## 2021-09-23 DIAGNOSIS — O09522 Supervision of elderly multigravida, second trimester: Secondary | ICD-10-CM | POA: Diagnosis not present

## 2021-09-23 DIAGNOSIS — Z3A19 19 weeks gestation of pregnancy: Secondary | ICD-10-CM | POA: Diagnosis not present

## 2021-11-10 DIAGNOSIS — Z3689 Encounter for other specified antenatal screening: Secondary | ICD-10-CM | POA: Diagnosis not present

## 2021-11-10 DIAGNOSIS — O09522 Supervision of elderly multigravida, second trimester: Secondary | ICD-10-CM | POA: Diagnosis not present

## 2021-11-10 DIAGNOSIS — Z3A26 26 weeks gestation of pregnancy: Secondary | ICD-10-CM | POA: Diagnosis not present

## 2021-11-10 DIAGNOSIS — O43122 Velamentous insertion of umbilical cord, second trimester: Secondary | ICD-10-CM | POA: Diagnosis not present

## 2021-11-29 DIAGNOSIS — O09523 Supervision of elderly multigravida, third trimester: Secondary | ICD-10-CM | POA: Diagnosis not present

## 2021-11-29 DIAGNOSIS — O99013 Anemia complicating pregnancy, third trimester: Secondary | ICD-10-CM | POA: Diagnosis not present

## 2021-11-29 DIAGNOSIS — O43123 Velamentous insertion of umbilical cord, third trimester: Secondary | ICD-10-CM | POA: Diagnosis not present

## 2021-11-29 DIAGNOSIS — Z3A28 28 weeks gestation of pregnancy: Secondary | ICD-10-CM | POA: Diagnosis not present

## 2021-11-29 DIAGNOSIS — Z23 Encounter for immunization: Secondary | ICD-10-CM | POA: Diagnosis not present

## 2021-12-12 DIAGNOSIS — Z3A30 30 weeks gestation of pregnancy: Secondary | ICD-10-CM | POA: Diagnosis not present

## 2021-12-12 DIAGNOSIS — O09523 Supervision of elderly multigravida, third trimester: Secondary | ICD-10-CM | POA: Diagnosis not present

## 2021-12-12 DIAGNOSIS — O43123 Velamentous insertion of umbilical cord, third trimester: Secondary | ICD-10-CM | POA: Diagnosis not present

## 2021-12-12 DIAGNOSIS — Z3689 Encounter for other specified antenatal screening: Secondary | ICD-10-CM | POA: Diagnosis not present

## 2022-01-05 DIAGNOSIS — Z3482 Encounter for supervision of other normal pregnancy, second trimester: Secondary | ICD-10-CM | POA: Diagnosis not present

## 2022-01-05 DIAGNOSIS — Z3483 Encounter for supervision of other normal pregnancy, third trimester: Secondary | ICD-10-CM | POA: Diagnosis not present

## 2022-01-11 DIAGNOSIS — Z3A34 34 weeks gestation of pregnancy: Secondary | ICD-10-CM | POA: Diagnosis not present

## 2022-01-11 DIAGNOSIS — O43123 Velamentous insertion of umbilical cord, third trimester: Secondary | ICD-10-CM | POA: Diagnosis not present

## 2022-01-11 DIAGNOSIS — O09523 Supervision of elderly multigravida, third trimester: Secondary | ICD-10-CM | POA: Diagnosis not present

## 2022-01-24 DIAGNOSIS — Z3A36 36 weeks gestation of pregnancy: Secondary | ICD-10-CM | POA: Diagnosis not present

## 2022-01-24 DIAGNOSIS — O43123 Velamentous insertion of umbilical cord, third trimester: Secondary | ICD-10-CM | POA: Diagnosis not present

## 2022-01-24 DIAGNOSIS — Z3685 Encounter for antenatal screening for Streptococcus B: Secondary | ICD-10-CM | POA: Diagnosis not present

## 2022-01-24 DIAGNOSIS — O09523 Supervision of elderly multigravida, third trimester: Secondary | ICD-10-CM | POA: Diagnosis not present

## 2022-01-24 LAB — OB RESULTS CONSOLE GBS: GBS: NEGATIVE

## 2022-02-08 ENCOUNTER — Telehealth (HOSPITAL_COMMUNITY): Payer: Self-pay | Admitting: *Deleted

## 2022-02-08 ENCOUNTER — Encounter (HOSPITAL_COMMUNITY): Payer: Self-pay | Admitting: *Deleted

## 2022-02-08 NOTE — Telephone Encounter (Signed)
Preadmission screen  

## 2022-02-13 ENCOUNTER — Telehealth (HOSPITAL_COMMUNITY): Payer: Self-pay | Admitting: *Deleted

## 2022-02-13 NOTE — Telephone Encounter (Signed)
Preadmission screen  

## 2022-02-15 ENCOUNTER — Telehealth (HOSPITAL_COMMUNITY): Payer: Self-pay | Admitting: *Deleted

## 2022-02-15 NOTE — Telephone Encounter (Signed)
Preadmission screen  

## 2022-02-16 ENCOUNTER — Other Ambulatory Visit: Payer: Self-pay | Admitting: Obstetrics & Gynecology

## 2022-02-17 ENCOUNTER — Inpatient Hospital Stay (HOSPITAL_COMMUNITY): Payer: 59

## 2022-02-17 ENCOUNTER — Inpatient Hospital Stay (HOSPITAL_COMMUNITY): Payer: 59 | Admitting: Anesthesiology

## 2022-02-17 ENCOUNTER — Encounter (HOSPITAL_COMMUNITY)
Admission: AD | Disposition: A | Discharge status: Home / Self Care | Payer: Self-pay | Source: Home / Self Care | Attending: Obstetrics & Gynecology

## 2022-02-17 ENCOUNTER — Encounter (HOSPITAL_COMMUNITY): Payer: Self-pay | Admitting: Obstetrics & Gynecology

## 2022-02-17 ENCOUNTER — Inpatient Hospital Stay (HOSPITAL_COMMUNITY)
Admission: AD | Admit: 2022-02-17 | Discharge: 2022-02-19 | DRG: 786 | Disposition: A | Discharge status: Home / Self Care | Payer: 59 | Attending: Obstetrics & Gynecology | Admitting: Obstetrics & Gynecology

## 2022-02-17 DIAGNOSIS — O4593 Premature separation of placenta, unspecified, third trimester: Secondary | ICD-10-CM | POA: Diagnosis present

## 2022-02-17 DIAGNOSIS — Z3A00 Weeks of gestation of pregnancy not specified: Secondary | ICD-10-CM

## 2022-02-17 DIAGNOSIS — O99214 Obesity complicating childbirth: Secondary | ICD-10-CM | POA: Diagnosis present

## 2022-02-17 DIAGNOSIS — O368390 Maternal care for abnormalities of the fetal heart rate or rhythm, unspecified trimester, not applicable or unspecified: Secondary | ICD-10-CM

## 2022-02-17 DIAGNOSIS — O43123 Velamentous insertion of umbilical cord, third trimester: Secondary | ICD-10-CM | POA: Diagnosis present

## 2022-02-17 DIAGNOSIS — Z3490 Encounter for supervision of normal pregnancy, unspecified, unspecified trimester: Secondary | ICD-10-CM | POA: Diagnosis present

## 2022-02-17 DIAGNOSIS — O480 Post-term pregnancy: Secondary | ICD-10-CM | POA: Diagnosis not present

## 2022-02-17 DIAGNOSIS — O9081 Anemia of the puerperium: Secondary | ICD-10-CM | POA: Diagnosis not present

## 2022-02-17 DIAGNOSIS — D62 Acute posthemorrhagic anemia: Secondary | ICD-10-CM | POA: Diagnosis not present

## 2022-02-17 DIAGNOSIS — O99892 Other specified diseases and conditions complicating childbirth: Secondary | ICD-10-CM | POA: Diagnosis not present

## 2022-02-17 DIAGNOSIS — Z3A40 40 weeks gestation of pregnancy: Secondary | ICD-10-CM

## 2022-02-17 DIAGNOSIS — O4590 Premature separation of placenta, unspecified, unspecified trimester: Secondary | ICD-10-CM | POA: Diagnosis not present

## 2022-02-17 LAB — TYPE AND SCREEN
ABO/RH(D): AB POS
Antibody Screen: NEGATIVE

## 2022-02-17 LAB — CBC
HCT: 33.5 % — ABNORMAL LOW (ref 36.0–46.0)
Hemoglobin: 11.2 g/dL — ABNORMAL LOW (ref 12.0–15.0)
MCH: 30.2 pg (ref 26.0–34.0)
MCHC: 33.4 g/dL (ref 30.0–36.0)
MCV: 90.3 fL (ref 80.0–100.0)
Platelets: 237 10*3/uL (ref 150–400)
RBC: 3.71 MIL/uL — ABNORMAL LOW (ref 3.87–5.11)
RDW: 14.6 % (ref 11.5–15.5)
WBC: 5.5 10*3/uL (ref 4.0–10.5)
nRBC: 0 % (ref 0.0–0.2)

## 2022-02-17 LAB — RPR: RPR Ser Ql: NONREACTIVE

## 2022-02-17 SURGERY — Surgical Case
Anesthesia: Epidural

## 2022-02-17 MED ORDER — ONDANSETRON HCL 4 MG/2ML IJ SOLN: 4.0000 mg | Freq: Four times a day (QID) | INTRAMUSCULAR | Status: DC | PRN

## 2022-02-17 MED ORDER — SENNOSIDES-DOCUSATE SODIUM 8.6-50 MG PO TABS
2.0000 | ORAL_TABLET | Freq: Every day | ORAL | Status: DC
  Administered 2022-02-18 – 2022-02-19 (×2): 2 via ORAL
  Filled 2022-02-17 (×2): qty 2

## 2022-02-17 MED ORDER — OXYTOCIN-SODIUM CHLORIDE 30-0.9 UT/500ML-% IV SOLN
2.5000 [IU]/h | INTRAVENOUS | Status: AC
  Administered 2022-02-17 – 2022-02-18 (×2): 2.5 [IU]/h via INTRAVENOUS
  Filled 2022-02-17: qty 500

## 2022-02-17 MED ORDER — IBUPROFEN 600 MG PO TABS
600.0000 mg | ORAL_TABLET | Freq: Four times a day (QID) | ORAL | Status: DC
  Administered 2022-02-18 – 2022-02-19 (×4): 600 mg via ORAL
  Filled 2022-02-17 (×4): qty 1

## 2022-02-17 MED ORDER — TRANEXAMIC ACID-NACL 1000-0.7 MG/100ML-% IV SOLN
INTRAVENOUS | Status: AC
  Filled 2022-02-17: qty 100

## 2022-02-17 MED ORDER — ACETAMINOPHEN 10 MG/ML IV SOLN
INTRAVENOUS | Status: DC | PRN
  Administered 2022-02-17: 1000 mg via INTRAVENOUS

## 2022-02-17 MED ORDER — MENTHOL 3 MG MT LOZG: 1.0000 | LOZENGE | OROMUCOSAL | Status: DC | PRN

## 2022-02-17 MED ORDER — SODIUM CHLORIDE 0.9% FLUSH: 3.0000 mL | INTRAVENOUS | Status: DC | PRN

## 2022-02-17 MED ORDER — TRANEXAMIC ACID-NACL 1000-0.7 MG/100ML-% IV SOLN
INTRAVENOUS | Status: DC | PRN
  Administered 2022-02-17: 1000 mg via INTRAVENOUS

## 2022-02-17 MED ORDER — OXYCODONE HCL 5 MG PO TABS: 5.0000 mg | ORAL_TABLET | Freq: Four times a day (QID) | ORAL | Status: DC | PRN

## 2022-02-17 MED ORDER — STERILE WATER FOR IRRIGATION IR SOLN
Status: DC | PRN
  Administered 2022-02-17: 1

## 2022-02-17 MED ORDER — DIBUCAINE (PERIANAL) 1 % EX OINT: 1.0000 "application " | TOPICAL_OINTMENT | CUTANEOUS | Status: DC | PRN

## 2022-02-17 MED ORDER — FENTANYL CITRATE (PF) 100 MCG/2ML IJ SOLN
INTRAMUSCULAR | Status: DC | PRN
  Administered 2022-02-17: 100 ug via INTRAVENOUS

## 2022-02-17 MED ORDER — KETOROLAC TROMETHAMINE 30 MG/ML IJ SOLN
30.0000 mg | Freq: Four times a day (QID) | INTRAMUSCULAR | Status: AC
  Administered 2022-02-18: 30 mg via INTRAVENOUS
  Filled 2022-02-17: qty 1

## 2022-02-17 MED ORDER — DIPHENHYDRAMINE HCL 25 MG PO CAPS
25.0000 mg | ORAL_CAPSULE | ORAL | Status: DC | PRN
  Filled 2022-02-17: qty 1

## 2022-02-17 MED ORDER — PHENYLEPHRINE 40 MCG/ML (10ML) SYRINGE FOR IV PUSH (FOR BLOOD PRESSURE SUPPORT)
PREFILLED_SYRINGE | INTRAVENOUS | Status: AC
Start: 2022-02-17 — End: ?
  Filled 2022-02-17: qty 10

## 2022-02-17 MED ORDER — LACTATED RINGERS IV SOLN
500.0000 mL | Freq: Once | INTRAVENOUS | Status: AC
  Administered 2022-02-17: 500 mL via INTRAVENOUS

## 2022-02-17 MED ORDER — MORPHINE SULFATE (PF) 0.5 MG/ML IJ SOLN
INTRAMUSCULAR | Status: DC | PRN
  Administered 2022-02-17: 2 mg via INTRAVENOUS

## 2022-02-17 MED ORDER — SIMETHICONE 80 MG PO CHEW
80.0000 mg | CHEWABLE_TABLET | Freq: Three times a day (TID) | ORAL | Status: DC
  Administered 2022-02-18 – 2022-02-19 (×3): 80 mg via ORAL
  Filled 2022-02-17 (×5): qty 1

## 2022-02-17 MED ORDER — ALBUMIN HUMAN 5 % IV SOLN: INTRAVENOUS | Status: DC | PRN

## 2022-02-17 MED ORDER — ACETAMINOPHEN 325 MG PO TABS: 650.0000 mg | ORAL_TABLET | ORAL | Status: DC | PRN

## 2022-02-17 MED ORDER — EPHEDRINE 5 MG/ML INJ: 10.0000 mg | INTRAVENOUS | Status: DC | PRN

## 2022-02-17 MED ORDER — PHENYLEPHRINE 40 MCG/ML (10ML) SYRINGE FOR IV PUSH (FOR BLOOD PRESSURE SUPPORT)
PREFILLED_SYRINGE | INTRAVENOUS | Status: DC | PRN
  Administered 2022-02-17 (×5): 40 ug via INTRAVENOUS

## 2022-02-17 MED ORDER — LACTATED RINGERS IV SOLN
INTRAVENOUS | Status: DC
Start: 2022-02-17 — End: 2022-02-19

## 2022-02-17 MED ORDER — MORPHINE SULFATE (PF) 0.5 MG/ML IJ SOLN
INTRAMUSCULAR | Status: AC
  Filled 2022-02-17: qty 10

## 2022-02-17 MED ORDER — OXYTOCIN BOLUS FROM INFUSION: 333.0000 mL | Freq: Once | INTRAVENOUS | Status: DC

## 2022-02-17 MED ORDER — TETANUS-DIPHTH-ACELL PERTUSSIS 5-2.5-18.5 LF-MCG/0.5 IM SUSY: 0.5000 mL | PREFILLED_SYRINGE | Freq: Once | INTRAMUSCULAR | Status: DC

## 2022-02-17 MED ORDER — ONDANSETRON HCL 4 MG/2ML IJ SOLN
INTRAMUSCULAR | Status: DC | PRN
  Administered 2022-02-17: 4 mg via INTRAVENOUS

## 2022-02-17 MED ORDER — CEFAZOLIN SODIUM-DEXTROSE 2-4 GM/100ML-% IV SOLN
INTRAVENOUS | Status: AC
  Filled 2022-02-17: qty 100

## 2022-02-17 MED ORDER — TERBUTALINE SULFATE 1 MG/ML IJ SOLN
0.2500 mg | Freq: Once | INTRAMUSCULAR | Status: AC | PRN
  Administered 2022-02-17: 0.25 mg via SUBCUTANEOUS
  Filled 2022-02-17: qty 1

## 2022-02-17 MED ORDER — SIMETHICONE 80 MG PO CHEW: 80.0000 mg | CHEWABLE_TABLET | ORAL | Status: DC | PRN

## 2022-02-17 MED ORDER — DEXAMETHASONE SODIUM PHOSPHATE 10 MG/ML IJ SOLN
INTRAMUSCULAR | Status: AC
  Filled 2022-02-17: qty 1

## 2022-02-17 MED ORDER — DIPHENHYDRAMINE HCL 25 MG PO CAPS
25.0000 mg | ORAL_CAPSULE | Freq: Four times a day (QID) | ORAL | Status: DC | PRN
  Administered 2022-02-17: 25 mg via ORAL

## 2022-02-17 MED ORDER — FENTANYL-BUPIVACAINE-NACL 0.5-0.125-0.9 MG/250ML-% EP SOLN
12.0000 mL/h | EPIDURAL | Status: DC | PRN
  Administered 2022-02-17: 12 mL/h via EPIDURAL
  Filled 2022-02-17: qty 250

## 2022-02-17 MED ORDER — KETOROLAC TROMETHAMINE 30 MG/ML IJ SOLN: 30.0000 mg | Freq: Four times a day (QID) | INTRAMUSCULAR | Status: DC | PRN

## 2022-02-17 MED ORDER — MEPERIDINE HCL 25 MG/ML IJ SOLN: 6.2500 mg | INTRAMUSCULAR | Status: DC | PRN

## 2022-02-17 MED ORDER — NALOXONE HCL 4 MG/10ML IJ SOLN
1.0000 ug/kg/h | INTRAVENOUS | Status: DC | PRN
  Filled 2022-02-17: qty 5

## 2022-02-17 MED ORDER — OXYTOCIN-SODIUM CHLORIDE 30-0.9 UT/500ML-% IV SOLN
INTRAVENOUS | Status: DC | PRN
  Administered 2022-02-17 (×2): 62.5 mL/h via INTRAVENOUS

## 2022-02-17 MED ORDER — NALOXONE HCL 0.4 MG/ML IJ SOLN: 0.4000 mg | INTRAMUSCULAR | Status: DC | PRN

## 2022-02-17 MED ORDER — LACTATED RINGERS IV SOLN: INTRAVENOUS | Status: DC

## 2022-02-17 MED ORDER — ACETAMINOPHEN 325 MG PO TABS
650.0000 mg | ORAL_TABLET | ORAL | Status: DC | PRN
  Administered 2022-02-18 (×2): 650 mg via ORAL
  Filled 2022-02-17 (×2): qty 2

## 2022-02-17 MED ORDER — DEXAMETHASONE SODIUM PHOSPHATE 10 MG/ML IJ SOLN
INTRAMUSCULAR | Status: DC | PRN
  Administered 2022-02-17: 10 mg via INTRAVENOUS

## 2022-02-17 MED ORDER — METHYLERGONOVINE MALEATE 0.2 MG PO TABS
0.2000 mg | ORAL_TABLET | Freq: Three times a day (TID) | ORAL | Status: AC
  Administered 2022-02-17 – 2022-02-18 (×3): 0.2 mg via ORAL
  Filled 2022-02-17 (×3): qty 1

## 2022-02-17 MED ORDER — FENTANYL CITRATE (PF) 100 MCG/2ML IJ SOLN: 25.0000 ug | INTRAMUSCULAR | Status: DC | PRN

## 2022-02-17 MED ORDER — OXYTOCIN-SODIUM CHLORIDE 30-0.9 UT/500ML-% IV SOLN: 2.5000 [IU]/h | INTRAVENOUS | Status: DC

## 2022-02-17 MED ORDER — ZOLPIDEM TARTRATE 5 MG PO TABS: 5.0000 mg | ORAL_TABLET | Freq: Every evening | ORAL | Status: DC | PRN

## 2022-02-17 MED ORDER — OXYTOCIN-SODIUM CHLORIDE 30-0.9 UT/500ML-% IV SOLN
INTRAVENOUS | Status: AC
  Filled 2022-02-17: qty 500

## 2022-02-17 MED ORDER — SOD CITRATE-CITRIC ACID 500-334 MG/5ML PO SOLN
30.0000 mL | ORAL | Status: DC | PRN
  Filled 2022-02-17: qty 30

## 2022-02-17 MED ORDER — FENTANYL CITRATE (PF) 100 MCG/2ML IJ SOLN
INTRAMUSCULAR | Status: AC
  Filled 2022-02-17: qty 2

## 2022-02-17 MED ORDER — PHENYLEPHRINE 40 MCG/ML (10ML) SYRINGE FOR IV PUSH (FOR BLOOD PRESSURE SUPPORT)
PREFILLED_SYRINGE | INTRAVENOUS | Status: AC
Start: 2022-02-17 — End: ?
  Filled 2022-02-17: qty 20

## 2022-02-17 MED ORDER — PHENYLEPHRINE 40 MCG/ML (10ML) SYRINGE FOR IV PUSH (FOR BLOOD PRESSURE SUPPORT): 80.0000 ug | PREFILLED_SYRINGE | INTRAVENOUS | Status: DC | PRN

## 2022-02-17 MED ORDER — ALBUMIN HUMAN 5 % IV SOLN
INTRAVENOUS | Status: AC
  Filled 2022-02-17: qty 250

## 2022-02-17 MED ORDER — CEFAZOLIN SODIUM-DEXTROSE 2-3 GM-%(50ML) IV SOLR
INTRAVENOUS | Status: DC | PRN
Start: 2022-02-17 — End: 2022-02-17
  Administered 2022-02-17: 2 g via INTRAVENOUS

## 2022-02-17 MED ORDER — LIDOCAINE HCL (PF) 1 % IJ SOLN: 30.0000 mL | INTRAMUSCULAR | Status: DC | PRN

## 2022-02-17 MED ORDER — LACTATED RINGERS IV SOLN: 500.0000 mL | INTRAVENOUS | Status: DC | PRN

## 2022-02-17 MED ORDER — DIPHENHYDRAMINE HCL 50 MG/ML IJ SOLN: 12.5000 mg | INTRAMUSCULAR | Status: DC | PRN

## 2022-02-17 MED ORDER — PRENATAL MULTIVITAMIN CH
1.0000 | ORAL_TABLET | Freq: Every day | ORAL | Status: DC
  Administered 2022-02-18 – 2022-02-19 (×2): 1 via ORAL
  Filled 2022-02-17 (×2): qty 1

## 2022-02-17 MED ORDER — OXYTOCIN-SODIUM CHLORIDE 30-0.9 UT/500ML-% IV SOLN
1.0000 m[IU]/min | INTRAVENOUS | Status: DC
  Administered 2022-02-17: 2 m[IU]/min via INTRAVENOUS
  Filled 2022-02-17: qty 500

## 2022-02-17 MED ORDER — WITCH HAZEL-GLYCERIN EX PADS: 1.0000 "application " | MEDICATED_PAD | CUTANEOUS | Status: DC | PRN

## 2022-02-17 MED ORDER — ONDANSETRON HCL 4 MG/2ML IJ SOLN: 4.0000 mg | Freq: Three times a day (TID) | INTRAMUSCULAR | Status: DC | PRN

## 2022-02-17 MED ORDER — LIDOCAINE HCL (PF) 1 % IJ SOLN
INTRAMUSCULAR | Status: DC | PRN
  Administered 2022-02-17 (×4): 5 mL via EPIDURAL

## 2022-02-17 MED ORDER — MORPHINE SULFATE (PF) 0.5 MG/ML IJ SOLN
INTRAMUSCULAR | Status: DC | PRN
  Administered 2022-02-17: 3 mg via EPIDURAL

## 2022-02-17 MED ORDER — ONDANSETRON HCL 4 MG/2ML IJ SOLN
INTRAMUSCULAR | Status: AC
  Filled 2022-02-17: qty 2

## 2022-02-17 MED ORDER — COCONUT OIL OIL: 1.0000 "application " | TOPICAL_OIL | Status: DC | PRN

## 2022-02-17 MED ORDER — PHENYLEPHRINE HCL-NACL 20-0.9 MG/250ML-% IV SOLN
INTRAVENOUS | Status: DC | PRN
  Administered 2022-02-17: 60 ug/min via INTRAVENOUS

## 2022-02-17 SURGICAL SUPPLY — 38 items
BENZOIN TINCTURE PRP APPL 2/3 (GAUZE/BANDAGES/DRESSINGS) ×2 IMPLANT
CLAMP CORD UMBIL (MISCELLANEOUS) ×2 IMPLANT
CLOSURE STERI STRIP 1/2 X4 (GAUZE/BANDAGES/DRESSINGS) ×1 IMPLANT
CLOTH BEACON ORANGE TIMEOUT ST (SAFETY) ×2 IMPLANT
DRSG OPSITE POSTOP 4X10 (GAUZE/BANDAGES/DRESSINGS) ×2 IMPLANT
ELECT REM PT RETURN 9FT ADLT (ELECTROSURGICAL) ×2
ELECTRODE REM PT RTRN 9FT ADLT (ELECTROSURGICAL) ×1 IMPLANT
EXTRACTOR VACUUM KIWI (MISCELLANEOUS) ×2 IMPLANT
GLOVE SURG UNDER POLY LF SZ6 (GLOVE) ×2 IMPLANT
GLOVE SURG UNDER POLY LF SZ6.5 (GLOVE) ×2 IMPLANT
GLOVE SURG UNDER POLY LF SZ7 (GLOVE) ×4 IMPLANT
GOWN STRL REUS W/TWL LRG LVL3 (GOWN DISPOSABLE) ×4 IMPLANT
KIT ABG SYR 3ML LUER SLIP (SYRINGE) IMPLANT
NDL HYPO 25X5/8 SAFETYGLIDE (NEEDLE) IMPLANT
NEEDLE HYPO 25X5/8 SAFETYGLIDE (NEEDLE) IMPLANT
NS IRRIG 1000ML POUR BTL (IV SOLUTION) ×2 IMPLANT
PACK C SECTION WH (CUSTOM PROCEDURE TRAY) ×2 IMPLANT
PAD ABD DERMACEA PRESS 5X9 (GAUZE/BANDAGES/DRESSINGS) ×1 IMPLANT
PAD OB MATERNITY 4.3X12.25 (PERSONAL CARE ITEMS) ×2 IMPLANT
PENCIL SMOKE EVAC W/HOLSTER (ELECTROSURGICAL) ×2 IMPLANT
RETRACTOR WND ALEXIS 25 LRG (MISCELLANEOUS) ×1 IMPLANT
RTRCTR WOUND ALEXIS 25CM LRG (MISCELLANEOUS) ×2
STRIP CLOSURE SKIN 1/2X4 (GAUZE/BANDAGES/DRESSINGS) IMPLANT
SUT MNCRL 0 VIOLET CTX 36 (SUTURE) ×2 IMPLANT
SUT MONOCRYL 0 CTX 36 (SUTURE) ×3
SUT PLAIN 2 0 XLH (SUTURE) ×1 IMPLANT
SUT VIC AB 0 CT1 27 (SUTURE) ×2
SUT VIC AB 0 CT1 27XBRD ANBCTR (SUTURE) IMPLANT
SUT VIC AB 0 CT1 36 (SUTURE) ×2 IMPLANT
SUT VIC AB 2-0 CT1 27 (SUTURE) ×1
SUT VIC AB 2-0 CT1 TAPERPNT 27 (SUTURE) IMPLANT
SUT VIC AB 3-0 CT1 27 (SUTURE)
SUT VIC AB 3-0 CT1 TAPERPNT 27 (SUTURE) IMPLANT
SUT VIC AB 4-0 KS 27 (SUTURE) ×1 IMPLANT
SUT VIC AB 4-0 PS2 27 (SUTURE) ×2 IMPLANT
TOWEL OR 17X24 6PK STRL BLUE (TOWEL DISPOSABLE) ×2 IMPLANT
TRAY FOLEY W/BAG SLVR 14FR LF (SET/KITS/TRAYS/PACK) IMPLANT
WATER STERILE IRR 1000ML POUR (IV SOLUTION) ×2 IMPLANT

## 2022-02-17 NOTE — Anesthesia Postprocedure Evaluation (Signed)
Anesthesia Post Note ? ?Patient: Sherri Logan ? ?Procedure(s) Performed: CESAREAN SECTION ? ?  ? ?Patient location during evaluation: Mother Baby ?Anesthesia Type: Epidural ?Level of consciousness: awake and alert ?Pain management: pain level controlled ?Vital Signs Assessment: post-procedure vital signs reviewed and stable ?Respiratory status: spontaneous breathing, nonlabored ventilation and respiratory function stable ?Cardiovascular status: stable ?Postop Assessment: no headache, no backache, epidural receding and patient able to bend at knees ?Anesthetic complications: no ? ? ?No notable events documented. ? ?Last Vitals:  ?Vitals:  ? 02/17/22 1414 02/17/22 1415  ?BP:    ?Pulse: 97   ?Resp: (!) 23 18  ?Temp:    ?SpO2: 100%   ?  ?Last Pain:  ?Vitals:  ? 02/17/22 1410  ?PainSc: 0-No pain  ? ?Pain Goal:   ? ?  ?  ?  ?  ?  ?  ?  ? ?Felipe Cabell A. ? ? ? ? ?

## 2022-02-17 NOTE — Lactation Note (Addendum)
This note was copied from a baby's chart. ?Lactation Consultation Note ? ?Patient Name: Sherri Logan ?Today's Date: 02/17/2022 ?Reason for consult: Initial assessment;Mother's request;Term;Breastfeeding assistance ?Age:36 hours ?Mom experienced with breastfeeding.  ?Mom feeding plan to EBF.  ?Infant recent latched at 1830 for 20 min and resting comfortably during the visit.  ?LC reviewed feeding cues, latch positions and how to ensure deep latch.  ?Mom to call for latch assistance with next feeding.  ? ?Plan 1. To feed based on cues 8-12x 24hr period. Mom to offer breasts and look for signs of milk transfer.  ?2. Mom to offer EBM on spoon 5-7 ml if unable to get infant to latch, then try latching.  ?3. I and O sheet reviewed.  ?All questions answered at end of the visit.  ? ?Mom called for latch assistance see flowsheet. Infant latched with signs of milk transfer.  ?Mom has electric pump at home.  ?Maternal Data ?Has patient been taught Hand Expression?: Yes ?Does the patient have breastfeeding experience prior to this delivery?: Yes ?How long did the patient breastfeed?: breast fed all of her children last one over 1 year ? ?Feeding ?Mother's Current Feeding Choice: Breast Milk ? ?LATCH Score ?  ? ?  ? ?  ? ?  ? ?  ? ?  ? ? ?Lactation Tools Discussed/Used ?  ? ?Interventions ?Interventions: Breast feeding basics reviewed;Hand express;Skin to skin;Position options;Expressed milk;Education;LC Psychologist, educational;Infant Driven Feeding Algorithm education ? ?Discharge ?Pump: Personal ?WIC Program: No ? ?Consult Status ?Consult Status: Follow-up ?Date: 02/18/22 ?Follow-up type: In-patient ? ? ? ?Sherri Shishido  Logan ?02/17/2022, 6:51 PM ? ? ? ?

## 2022-02-17 NOTE — Anesthesia Procedure Notes (Signed)
Epidural ?Patient location during procedure: OB ?Start time: 02/17/2022 11:13 AM ?End time: 02/17/2022 11:21 AM ? ?Staffing ?Anesthesiologist: Mal Amabile, MD ?Performed: anesthesiologist  ? ?Preanesthetic Checklist ?Completed: patient identified, IV checked, site marked, risks and benefits discussed, surgical consent, monitors and equipment checked, pre-op evaluation and timeout performed ? ?Epidural ?Patient position: sitting ?Prep: DuraPrep and site prepped and draped ?Patient monitoring: continuous pulse ox and blood pressure ?Approach: midline ?Location: L3-L4 ?Injection technique: LOR air ? ?Needle:  ?Needle type: Tuohy  ?Needle gauge: 17 G ?Needle length: 9 cm and 9 ?Needle insertion depth: 5 cm ?Catheter type: closed end flexible ?Catheter size: 19 Gauge ?Catheter at skin depth: 10 cm ?Test dose: negative and Other ? ?Assessment ?Events: blood not aspirated, injection not painful, no injection resistance, no paresthesia and negative IV test ? ?Additional Notes ?Patient identified. Risks and benefits discussed including failed block, incomplete  ?Pain control, post dural puncture headache, nerve damage, paralysis, blood pressure ?Changes, nausea, vomiting, reactions to medications-both toxic and allergic and post ?Partum back pain. All questions were answered. Patient expressed understanding and wished to proceed. Sterile technique was used throughout procedure. Epidural site was ?Dressed with sterile barrier dressing. No paresthesias, signs of intravascular injection ?Or signs of intrathecal spread were encountered.  ?Patient was more comfortable after the epidural was dosed. ?Please see RN's note for documentation of vital signs and FHR which are stable. ?Reason for block:procedure for pain ? ? ? ?

## 2022-02-17 NOTE — H&P (Addendum)
Sherri Logan is a 36 y.o. female presenting for IOL at 40.1 wks, AMA, favorable cervix. Prior 3 SVDs (2nd was preterm at 35 wks)  ?AMA, nl NIPT, Marginal cord, growth AGA in 3rd trim. EFW at 66% at 19 wks, 63% at 28 wks and 68% at 35 wks with AC 78% and nl AFI ?. ?OB History   ? ? Gravida  ?4  ? Para  ?3  ? Term  ?2  ? Preterm  ?1  ? AB  ?   ? Living  ?3  ?  ? ? SAB  ?   ? IAB  ?   ? Ectopic  ?   ? Multiple  ?0  ? Live Births  ?3  ?   ?  ?  ? ?Past Medical History:  ?Diagnosis Date  ? Postpartum care following vaginal delivery (5/3) 03/30/2016  ? ?History reviewed. No pertinent surgical history. ?Family History: family history includes Cancer in her maternal grandmother and paternal grandmother; Hypertension in her mother. ?Social History:  reports that she has never smoked. She has never used smokeless tobacco. She reports that she does not drink alcohol and does not use drugs. ? ? ?  ?Maternal Diabetes: No ?Genetic Screening: Normal ?Maternal Ultrasounds/Referrals: Normal except Marginal cord insertion  ?Fetal Ultrasounds or other Referrals:  None ?Maternal Substance Abuse:  No ?Significant Maternal Medications:  None ?Significant Maternal Lab Results:  Group B Strep negative ?Other Comments:  None ? ?Review of Systems ?History ? ?Blood pressure 113/73, pulse 69, temperature 98.8 ?F (37.1 ?C), currently breastfeeding. ?Exam ?Physical Exam  ?BP 113/73   Pulse 69   Temp 98.8 ?F (37.1 ?C)   Breastfeeding Yes  ? ?Physical exam:  ?A&O x 3, no acute distress. Pleasant ?HEENT neg, no thyromegaly ?Lungs CTA bilat ?CV RRR, A1S2 normal ?Abdo soft, non tender, non acute ?Extr no edema/ tenderness ?Pelvic Cx 2/ 50%/-4 Vx  ?FHT  130s + accels no decels mod variability-cat I ?Toco rare ? ?Prenatal labs: ?ABO, Rh: --/--/AB POS (03/24 0800) ?Antibody: NEG (03/24 0800) ?Rubella: Immune (08/29 0000) ?RPR: Nonreactive (08/29 0000)  ?HBsAg: Negative (08/29 0000)  ?HIV: Non-reactive (08/29 0000)  ?GBS: Negative/-- (02/28 0000)  ?NIPT  low risk ?AFP1 neg ?Glucola nl  ? ?Assessment/Plan: ?36 yo G4P2103 at 40.1  wks, IOL for dates and AMA and favorable cx.  ?Foley, pitocin, Epidural when ready.  ?FHT cat I  ?EFW 7.1/2 lbs Anticipate SVD ?MCI w/ AGA growth  ?  ? ?Elveria Royals ?02/17/2022, 2:00 PM ? ? ? ? ?

## 2022-02-17 NOTE — Transfer of Care (Signed)
Immediate Anesthesia Transfer of Care Note ? ?Patient: Sherri Logan ? ?Procedure(s) Performed: CESAREAN SECTION ? ?Patient Location: PACU ? ?Anesthesia Type:Epidural ? ?Level of Consciousness: awake, alert  and oriented ? ?Airway & Oxygen Therapy: Patient Spontanous Breathing ? ?Post-op Assessment: Report given to RN and Post -op Vital signs reviewed and stable ? ?Post vital signs: Reviewed and stable ? ?Last Vitals:  ?Vitals Value Taken Time  ?BP 113/51 02/17/22 1415  ?Temp 36.1 ?C 02/17/22 1410  ?Pulse 97 02/17/22 1414  ?Resp 12 02/17/22 1417  ?SpO2 100 % 02/17/22 1414  ?Vitals shown include unvalidated device data. ? ?Last Pain:  ?Vitals:  ? 02/17/22 1218  ?PainSc: 0-No pain  ?   ? ?  ? ?Complications: No notable events documented. ?

## 2022-02-17 NOTE — Progress Notes (Signed)
Requested to place foley bulb by primary provider, see H&P for full prenatal details ?Comfortable,not feeling ctx  ? ? ?Patient Vitals for the past 24 hrs: ? BP Temp Pulse  ?02/17/22 0749 138/70 98.8 ?F (37.1 ?C) 73  ? ?A&ox3 ?Nml respirations ?Abd: soft,nt,nd, gravid, efw 7 1/2lbs ?Cx: 1/50/-3; speculum placed and foley bulb placed w/o difficulty,speculum removed, 69ml saline then inflated balloon; pt tolerated well  ?LE: no edema, nt  ? ?FHT: 130s,nml variability, +accels, no decels ?TOCO; rare ctx ? ?A/P: iup at 40.1 wga ?IOL - plan pitocin and foley bulb for ripening, plan svd ?Rh pos ?RI ?GBS neg ?MCI - aga, 6lbs at 36wga ?Ama-nml nipt ?

## 2022-02-17 NOTE — Anesthesia Preprocedure Evaluation (Addendum)
Anesthesia Evaluation  ?Patient identified by MRN, date of birth, ID band ?Patient awake ? ? ? ?Reviewed: ?Allergy & Precautions, NPO status , Patient's Chart, lab work & pertinent test results ? ?Airway ?Mallampati: II ? ?TM Distance: >3 FB ?Neck ROM: Full ? ? ? Dental ?no notable dental hx. ?(+) Teeth Intact ?  ?Pulmonary ?neg pulmonary ROS,  ?  ?Pulmonary exam normal ?breath sounds clear to auscultation ? ? ? ? ? ? Cardiovascular ?negative cardio ROS ?Normal cardiovascular exam ?Rhythm:Regular Rate:Normal ? ? ?  ?Neuro/Psych ?negative neurological ROS ? negative psych ROS  ? GI/Hepatic ?Neg liver ROS, GERD  ,  ?Endo/Other  ?Obesity ? Renal/GU ?negative Renal ROS  ?negative genitourinary ?  ?Musculoskeletal ?negative musculoskeletal ROS ?(+)  ? Abdominal ?(+) + obese,   ?Peds ? Hematology ? ?(+) Blood dyscrasia, anemia ,   ?Anesthesia Other Findings ? ? Reproductive/Obstetrics ?(+) Pregnancy ? ?  ? ? ? ? ? ? ? ? ? ? ? ? ? ?  ?  ? ? ? ? ? ? ? ?Anesthesia Physical ?Anesthesia Plan ? ?ASA: 2 and emergent ? ?Anesthesia Plan: Epidural  ? ?Post-op Pain Management:   ? ?Induction:  ? ?PONV Risk Score and Plan: 4 or greater ? ?Airway Management Planned: Natural Airway ? ?Additional Equipment:  ? ?Intra-op Plan:  ? ?Post-operative Plan:  ? ?Informed Consent: I have reviewed the patients History and Physical, chart, labs and discussed the procedure including the risks, benefits and alternatives for the proposed anesthesia with the patient or authorized representative who has indicated his/her understanding and acceptance.  ? ? ? ?Dental advisory given ? ?Plan Discussed with: Anesthesiologist and CRNA ? ?Anesthesia Plan Comments: (STAT C/Section for fetal bradycardia. Will use epidural for C/Section. M. Royce Macadamia, MD)  ? ? ? ? ? ?Anesthesia Quick Evaluation ? ?

## 2022-02-17 NOTE — Progress Notes (Signed)
Late note - verbal report from L&D RN. Pt was being induced for favorable cx and dates. Prior 3 SVDs, last was largest 7'14"  ?Per L&D RN Sherri Logan, FHT cat I, low dose pitocin at 10 mu, s/p epidural. Some variable decels noted after SROM, copious fluid gushed per RN and FHT noted recurrent variable decels, so pitocin cut to 1/2 at 5 mu, then stopped, Terbutaline given, on call MD Dr Sherri Logan was on unit and called. On her exam 6 cm/ -2 and recurrent variable decel turned into prolonged bradycardia, remote from delivery, so C/s called at 10 min into bradycardia. In OR unable to get FHTs and stat c-section was started. I arrived at hysterotomy,noted blood clots from uterus. I gowned up and helped w/ baby delivery as there was difficulty, likely from tight abdominal wall, not much stretch and head was not well flexed.  ?I applied Kiwi vacuum but pump was not working so I was handed mushroom which worked, one single controlled application to complete head delivery and then rest of the baby delivered, appeared shocky, so immediate cord clamp and cut and handed to NICU. Cord gas (art) 7.06  ?I assisted Dr Sherri Logan to [facility] peritoneal closure and then I completed abdominal closure while Dr Sherri Logan scrubbed out to attend office  ?Mom baby well.  ?Lysteda given after baby was handed off due to excessive bleeding from hysterotomy cut all around and angles.  ? ?

## 2022-02-18 ENCOUNTER — Encounter (HOSPITAL_COMMUNITY): Payer: Self-pay | Admitting: Obstetrics & Gynecology

## 2022-02-18 DIAGNOSIS — D62 Acute posthemorrhagic anemia: Secondary | ICD-10-CM | POA: Diagnosis not present

## 2022-02-18 HISTORY — DX: Acute posthemorrhagic anemia: D62

## 2022-02-18 LAB — CBC
HCT: 25.8 % — ABNORMAL LOW (ref 36.0–46.0)
Hemoglobin: 8.5 g/dL — ABNORMAL LOW (ref 12.0–15.0)
MCH: 29.8 pg (ref 26.0–34.0)
MCHC: 32.9 g/dL (ref 30.0–36.0)
MCV: 90.5 fL (ref 80.0–100.0)
Platelets: 186 10*3/uL (ref 150–400)
RBC: 2.85 MIL/uL — ABNORMAL LOW (ref 3.87–5.11)
RDW: 14.4 % (ref 11.5–15.5)
WBC: 14 10*3/uL — ABNORMAL HIGH (ref 4.0–10.5)
nRBC: 0 % (ref 0.0–0.2)

## 2022-02-18 MED ORDER — SODIUM CHLORIDE 0.9 % IV SOLN
500.0000 mg | Freq: Once | INTRAVENOUS | Status: AC
  Administered 2022-02-18: 500 mg via INTRAVENOUS
  Filled 2022-02-18: qty 25

## 2022-02-18 NOTE — Progress Notes (Signed)
Previous RN from night shift reported that previous RN reported not to give Toradol or ibuprofen. Called Dr. Juliene Pina to verify. Dr. Juliene Pina verified that Toradol and Ibuprofen can still be given as ordered. Dr. Juliene Pina also ordered IV Venofer and instructed to give patient a dose of tylenol before hanging the Venofer. Maudry Diego Winslow ?  ?

## 2022-02-18 NOTE — Progress Notes (Signed)
Subjective: ?Postpartum Day 1: Cesarean Delivery- Emergency LTCS for fetal bradycardia, marginal abruption ?Patient reports nausea and vomiting last night but done well since. Ate breakfast. Ambulating, feels thighs are stiff w/ fluid.  Voiding well. NO bm yet. Pain well controlled, not taken narcotic  ? ?Objective: ?Vital signs in last 24 hours: ?Temp:  [98 ?F (36.7 ?C)-98.5 ?F (36.9 ?C)] 98.4 ?F (36.9 ?C) (03/25 1416) ?Pulse Rate:  [50-109] 72 (03/25 1416) ?Resp:  [0-24] 18 (03/25 1416) ?BP: (103-133)/(47-81) 130/70 (03/25 1416) ?SpO2:  [83 %-100 %] 99 % (03/25 1416) ?Weight:  [109.1 kg] 109.1 kg (03/24 1700) ? ?Physical Exam:  ?General: alert, cooperative, and appears stated age ?Lochia: appropriate ?Lungs CTA bl ?CV RRR ?Uterine Fundus: firm ?Incision: healing well, no significant drainage ?DVT Evaluation: No evidence of DVT seen on physical exam. ?Negative Homan's sign. ? ? ?  Latest Ref Rng & Units 02/18/2022  ?  4:16 AM 02/17/2022  ?  7:58 AM 08/04/2021  ?  9:34 AM  ?CBC  ?WBC 4.0 - 10.5 K/uL 14.0   5.5   6.1    ?Hemoglobin 12.0 - 15.0 g/dL 8.5   11.2   11.8    ?Hematocrit 36.0 - 46.0 % 25.8   33.5   34.1    ?Platelets 150 - 400 K/uL 186   237   240    ?  ?Rh pos ?Rub Imm  ? ?Assessment/Plan: ?POD #1 Emergency 1' LTCS for fetal bradycardia, marginal abruption after SROM. EBL 1400 cc  ?Post-op good recovery, pain controlled ?PP LC consult, breast feeding, milk not in yet  ?ABL anemia - s/p IV Feraheme tolerating well  ?. ? ?Sherri Logan ?02/18/2022, 2:23 PM ? ? ?

## 2022-02-18 NOTE — Progress Notes (Signed)
After patient showered, honeycomb dressing began to peel on right side and corner. Notified Dr. Juliene Pina who ordered to change honeycomb. Maudry Diego Naylor ? ?

## 2022-02-19 MED ORDER — FERROUS SULFATE 325 (65 FE) MG PO TABS: 325.0000 mg | ORAL_TABLET | Freq: Every day | ORAL | 3 refills | Status: AC

## 2022-02-19 MED ORDER — OXYCODONE HCL 5 MG PO TABS: 5.0000 mg | ORAL_TABLET | Freq: Four times a day (QID) | ORAL | 0 refills | Status: AC | PRN

## 2022-02-19 MED ORDER — ACETAMINOPHEN 325 MG PO TABS: 650.0000 mg | ORAL_TABLET | Freq: Four times a day (QID) | ORAL | Status: AC | PRN

## 2022-02-19 MED ORDER — IBUPROFEN 600 MG PO TABS: 600.0000 mg | ORAL_TABLET | Freq: Four times a day (QID) | ORAL | 0 refills | Status: AC | PRN

## 2022-02-19 NOTE — Discharge Summary (Signed)
? ?  Postpartum Discharge Summary ?  ?Patient Name: Sherri Logan ?DOB: 10/27/1986 ?MRN: [id number] ? ?Date of admission: 02/17/2022 ?Delivery date:02/17/2022  Delivering provider: MODY, VAISHALI and Susan Almquist  ?Date of discharge: 02/19/2022 ? ?Admitting diagnosis: Encounter for induction of labor [Z34.90], [redacted]w[redacted]d       ?Discharge diagnosis: Delivered by Emergency primary Low transverse cesarean section on 02/17/2022           ?Acute blood loss anemia                                   ?Post partum procedures: IV Venofer  ?Augmentation: AROM, Pitocin, and IP Foley ?Complications: Placental Abruption with fetal bradycardia and emergency C-section  ? ?Hospital course: Induction of Labor With Emergency Cesarean Section   ?36 y.o. yo G4P3104 at [redacted]w[redacted]d was admitted to the hospital 02/17/2022 for induction of labor. Patient had a labor course significant for fetal bradycardia after SROM. Pitocin was at 10 mu, was cut to 5 mu and then stopped and Terbutaline given but bradycardia didn't resolve. The patient went for cesarean section due to Non-Reassuring FHR and placenta abruption diagnosed at hysterotomy.  . Delivery details are as follows: ?Membrane Rupture Time/Date: 11:44 AM ,02/17/2022   ?Delivery Method:C-Section, Vacuum Assisted  ?Details of operation can be found in separate operative Note.  Patient had an uncomplicated postpartum course. She is ambulating, tolerating a regular diet, passing flatus, and urinating well.  Patient is discharged home in stable condition on 02/19/22.     ? ?Newborn Data: ?Birth date:02/17/20[birth year]  ?Birth time:1:11 PM  ?Gender:Female  ?Living status:Living  ?Apgars:6 ,9  ?Weight:3530 g                               ? ?Magnesium Sulfate received: NA ?BMZ received: No ?Rhophylac:N/A ?MMR:N/A ?T-DaP:Given prenatally ?Flu: Yes ?Transfusion:No ? ?Physical exam  ?Vitals:  ? 02/18/22 1416 02/18/22 2139 02/19/22 0552 02/19/22 0900  ?BP: 130/70 115/79 127/87 116/76  ?Pulse: 72 80 65   ?Resp: 18 16 15    ?Temp: 98.4 ?F (36.9 ?C) 98.3 ?F (36.8 ?C) (!) 97.5 ?F (36.4 ?C)   ?TempSrc: Oral Oral Oral   ?SpO2: 99%  100%   ?Weight:      ? ?General: alert, cooperative, and no distress ?Lochia: appropriate ?Uterine Fundus: firm ?Incision: Healing well with no significant drainage, No significant erythema ?DVT Evaluation: No evidence of DVT seen on physical exam. ?Negative Homan's sign. ?Labs: ?Lab Results  ?Component Value Date  ? WBC 14.0 (H) 02/18/2022  ? HGB 8.5 (L) 02/18/2022  ? HCT 25.8 (L) 02/18/2022  ? MCV 90.5 02/18/2022  ? PLT 186 02/18/2022  ? ?   ? View : No data to display.  ?  ?  ?  ? ?Edinburgh Score: ? ?  02/19/2022  ?  9:00 AM  ?Edinburgh Postnatal Depression Scale Screening Tool  ?I have been able to laugh and see the funny side of things. 0  ?I have looked forward with enjoyment to things. 0  ?I have blamed myself unnecessarily when things went wrong. 2  ?I have been anxious or worried for no good reason. 0  ?I have felt scared or panicky for no good reason. 1  ?Things have been getting on top of me. 1  ?I have been so unhappy that I have had difficulty sleeping. 1  ?I   have felt sad or miserable. 0  ?I have been so unhappy that I have been crying. 0  ?The thought of harming myself has occurred to me. 0  ?Edinburgh Postnatal Depression Scale Total 5  ? ? ? ? ?After visit meds:  ?Allergies as of 02/19/2022   ?No Known Allergies ?  ? ?  ?Medication List  ?  ? ?TAKE these medications   ? ?acetaminophen 325 MG tablet ?Commonly known as: TYLENOL ?Take 2 tablets (650 mg total) by mouth every 6 (six) hours as needed for mild pain (temperature > 101.5.). ?  ?ferrous sulfate 325 (65 FE) MG tablet ?Commonly known as: FerrouSul ?Take 1 tablet (325 mg total) by mouth daily with breakfast. ?  ?ibuprofen 600 MG tablet ?Commonly known as: ADVIL ?Take 1 tablet (600 mg total) by mouth every 6 (six) hours as needed. ?  ?oxyCODONE 5 MG immediate release tablet ?Commonly known as: Oxy IR/ROXICODONE ?Take 1 tablet (5 mg total) by  mouth every 6 (six) hours as needed for up to 3 days for moderate pain. ?  ?prenatal multivitamin Tabs tablet ?Take 1 tablet by mouth daily at 12 noon. ?  ? ?  ? ? ? ?Discharge home in stable condition ?Infant Feeding: Breast ?Infant Disposition:home with mother ?Discharge instruction: per After Visit Summary and Postpartum booklet. ?Activity: Advance as tolerated. Pelvic rest for 6 weeks.  ?Diet: routine diet ?Anticipated Birth Control: Unsure ?Postpartum Appointment:6 weeks ? ?Future Appointments:No future appointments. Pt to schedule  ?Follow up Visit: ? Follow-up Information   ? ? Mody, Vaishali, MD Follow up in 6 week(s).   ?Specialty: Obstetrics and Gynecology ?Contact information: ?[address] ?[place] [address] ?[phone] ? ? ?  ?  ? ?  ?  ? ?  ? ? ? ?  ? ?02/19/2022 ?Vaishali R Mody, MD ? ? ?

## 2022-02-19 NOTE — Lactation Note (Signed)
This note was copied from a baby's chart. ?Lactation Consultation Note ? ?Patient Name: Sherri Logan ?Today's Date: 02/19/2022 ?Reason for consult: Follow-up assessment ?Age:36 hours ? ?P4, Baby latched upon entering.  Intermittent swallows observed. ?Feed on demand with cues.  Goal 8-12+ times per day after first 24 hrs. ?Reviewed engorgement care and monitoring voids/stools. ? ? ? ?Feeding ?Mother's Current Feeding Choice: Breast Milk and Formula ?Nipple Type: Nfant Standard Flow (white) ? ?LATCH Score ?Latch: Grasps breast easily, tongue down, lips flanged, rhythmical sucking. (latched upon entering) ? ?Audible Swallowing: A few with stimulation ? ?Type of Nipple: Everted at rest and after stimulation ? ?Comfort (Breast/Nipple): Soft / non-tender ? ?Hold (Positioning): No assistance needed to correctly position infant at breast. ? ?LATCH Score: 9 ? ?Interventions ?Interventions: Breast feeding basics reviewed;Education ? ?Discharge ?Discharge Education: Engorgement and breast care ? ?Consult Status ?Consult Status: Complete ?Date: 02/19/22 ? ? ? ?Sherri Logan ?02/19/2022, 12:44 PM ? ? ? ?

## 2022-02-19 NOTE — Progress Notes (Signed)
Subjective: ?Postpartum Day 2: Cesarean Delivery- Emergency LTCS for fetal bradycardia, marginal abruption ?Feels very well and is ready for discharge. NO dizziness/ SOB/ CP. Ambulating, feels thighs are not stiff with fluid now.  Voiding well. NO bm yet. Pain well controlled, not taken narcotic  ? ?Objective: ?Vital signs in last 24 hours: ?Temp:  [97.5 ?F (36.4 ?C)-98.4 ?F (36.9 ?C)] 97.5 ?F (36.4 ?C) (03/26 [id number]) ?Pulse Rate:  [65-80] 65 (03/26 0552) ?Resp:  [15-18] 15 (03/26 0552) ?BP: (115-130)/(70-87) 116/76 (03/26 0900) ?SpO2:  [99 %-100 %] 100 % (03/26 0552) ? ?Physical Exam:  ?General: alert, cooperative, and appears stated age ?Lochia: appropriate ?Lungs CTA bl ?CV RRR ?Uterine Fundus: firm ?Incision: healing well, no significant drainage ?DVT Evaluation: No evidence of DVT seen on physical exam. ?Negative Homan's sign. ? ? ?  Latest Ref Rng & Units 02/18/2022  ?  4:16 AM 02/17/2022  ?  7:58 AM 08/04/2021  ?  9:34 AM  ?CBC  ?WBC 4.0 - 10.5 K/uL 14.0   5.5   6.1    ?Hemoglobin 12.0 - 15.0 g/dL 8.5   11.2   11.8    ?Hematocrit 36.0 - 46.0 % 25.8   33.5   34.1    ?Platelets 150 - 400 K/uL 186   237   240    ?  ?Rh pos ?Rub Imm  ? ?Assessment/Plan: ?POD #2  Emergency 1' LTCS for fetal bradycardia, marginal abruption after SROM. EBL 1400 cc  ?Post-op good recovery, pain controlled ?PP LC consult, breast feeding, milk not in yet  ?ABL anemia - s/p IV Feraheme tolerating well. Continue PO iron for 30 days  ?POst op care and warning ss d/w pt  ?No depression  ? ?Discharge home. Fup 6 wks w/ Dr Carrin Vannostrand for PP check  ? ?[place] ?02/19/2022, 12:54 PM ? ? ?

## 2022-02-20 NOTE — Op Note (Signed)
Cesarean Section Procedure Note  ? ?Sherri Logan ? ?02/17/2022 ? ?Indications: Fetal Distress  ? ?Pre-operative Diagnosis: stat cesarean section fetal heart rate indication - prolonged deceleration, bradycardia ? ?Post-operative Diagnosis: Same ; partial placental abruption ? ?Surgeon: Surgeon(s) and Role: ?   Amado Nash Candace Gallus, MD - Primary ?   Shea Evans, MD - Assisting  ? ?Assistants: Shea Evans, MD ? ?Anesthesia: epidural  ? ?Procedure Details:  ?The patient was seen in the labor Room - called by nursing re: FHT.  There was a prolonged deceleration over 10 min to 70s-90s.  This persisted despite pitocin turned off, terbutaline given. Cx noted 6cm.  Decision made for stat c/s. The risks, benefits, complications, treatment options, and expected outcomes were discussed with the patient. The patient concurred with the proposed plan, giving informed consent. identified as Sherri Logan and the procedure verified as C-Section Delivery. A Time Out was held and the above information confirmed.  ?Level of anesthesia  was achieved quickly with anesthesia, the patient was draped and prepped in the usual sterile manner, foley was draining urine well.  A pfannenstiel incision was made and carried down through the subcutaneous tissue to the fascia. Fascial incision was made and extended transversely. The fascia was separated from the underlying rectus tissue superiorly and inferiorly. The peritoneum was identified and entered bluntly. Peritoneal incision was extended longitudinally. Bladder blade placed.  A low transverse uterine incision was made after confirming position of bladder inferior to LUS. Attempt to deliver fetus but difficulty in getting fetal head to flex.  While attempting to deliver fetal head, Dr Juliene Pina was then present and vacuum was placed.  Pull x2 but still difficulty to deliver fetal head. The vaccuum was then removedn and able to then get head to flex and delivery through incision. Delivered from  cephalic presentation was a female infant with decreased tone. Apgar scores of 7 at one minute and 9 at five minutes. The cord was quickly clamped and cut and baby handed to NICU team in attendance. Cord ph was sent. Cord blood was obtained for evaluation. The placenta was removed  manually - bleeding noted at time of delivery c/w abruption, though most of placenta was still adherent to uterine wall.  . The uterine outline, tubes and ovaries appeared normal. The uterine incision was closed with running locked sutures of 0 monocryl. A second imbricating layer was sutured.   Hemostasis was observed.  Peritoneal closure done with 2-0 Vicryl.  The fascia was then reapproximated with running sutures of 0Vicryl. The subcuticular closure was performed using 2-0plain gut. The skin was closed with 4-0Vicryl.  ? ?Instrument, sponge, and needle counts were correct prior the abdominal closure and were correct at the conclusion of the case.  ? ? ?Findings: viable female infant, apgars 7/9, cord PH 7.06 (arterial); nml uterus, tubes/ov bilat; partial placental abruption ?  ?Estimated Blood Loss:  ? ?Total IV Fluids:  ? ?Urine Output:  50CC OF clear urine ? ?Specimens: placenta  ? ?Complications: no complications ? ?Disposition: PACU - hemodynamically stable.  ? ?Maternal Condition: stable  ? ?Baby condition / location:  Couplet care / Skin to Skin ? ?Attending Attestation: I performed the procedure.  ? ?Signed: ?Surgeon(s): ?[name], Candace Gallus, MD ?Shea Evans, MD ? ? ? ?  ? ?

## 2022-02-28 ENCOUNTER — Telehealth (HOSPITAL_COMMUNITY): Payer: Self-pay

## 2022-02-28 NOTE — Telephone Encounter (Signed)
"  We are doing great. I'm feeling good. My incision is doing good. I took my dressing off." Patient declines questions or concerns about her healing. ? ?"Baby is doing well. Eating and gaining weight. Baby sleeps in a bassinet." RN reviewed ABC's of safe sleep with patient. Patient declines any questions or concerns about baby. ? ?EPDS score is 5. ? ?Sharyn Lull Lamiyah Schlotter,RN3,MSN,RNC-MNN ?02/28/2022,1606 ?

## 2022-03-09 DIAGNOSIS — R238 Other skin changes: Secondary | ICD-10-CM | POA: Diagnosis not present

## 2022-03-09 DIAGNOSIS — N760 Acute vaginitis: Secondary | ICD-10-CM | POA: Diagnosis not present

## 2022-04-06 DIAGNOSIS — Z3202 Encounter for pregnancy test, result negative: Secondary | ICD-10-CM | POA: Diagnosis not present

## 2022-04-06 DIAGNOSIS — Z30430 Encounter for insertion of intrauterine contraceptive device: Secondary | ICD-10-CM | POA: Diagnosis not present

## 2022-08-03 DIAGNOSIS — E669 Obesity, unspecified: Secondary | ICD-10-CM | POA: Diagnosis not present

## 2022-08-03 DIAGNOSIS — Z6838 Body mass index (BMI) 38.0-38.9, adult: Secondary | ICD-10-CM | POA: Diagnosis not present

## 2022-08-17 ENCOUNTER — Other Ambulatory Visit (HOSPITAL_COMMUNITY): Payer: Self-pay

## 2022-08-18 ENCOUNTER — Other Ambulatory Visit (HOSPITAL_COMMUNITY): Payer: Self-pay

## 2022-08-18 MED ORDER — SAXENDA 18 MG/3ML ~~LOC~~ SOPN
PEN_INJECTOR | SUBCUTANEOUS | 0 refills | Status: AC
  Filled 2022-08-18: qty 15, 30d supply, fill #0

## 2022-08-30 DIAGNOSIS — Z111 Encounter for screening for respiratory tuberculosis: Secondary | ICD-10-CM | POA: Diagnosis not present

## 2022-08-30 DIAGNOSIS — Z01810 Encounter for preprocedural cardiovascular examination: Secondary | ICD-10-CM | POA: Diagnosis not present

## 2022-08-30 DIAGNOSIS — Z0000 Encounter for general adult medical examination without abnormal findings: Secondary | ICD-10-CM | POA: Diagnosis not present

## 2022-08-30 DIAGNOSIS — R7989 Other specified abnormal findings of blood chemistry: Secondary | ICD-10-CM | POA: Diagnosis not present

## 2022-11-11 IMAGING — US US OB COMP LESS 14 WK
1 series · 15 of 28 positions shown · non-contrast
Comparison: None

CLINICAL DATA: Vaginal bleeding during first trimester of
pregnancy, RIGHT-sided pelvic pain, spotting, assigned gestational
age 12 weeks 0 days, G4P3

EXAM:
OBSTETRIC <14 WK ULTRASOUND
TECHNIQUE: Transabdominal ultrasound was performed for evaluation of the
gestation as well as the maternal uterus and adnexal regions.

[Series 1: us ob comp less 14 wk · 36 acquisitions, 15 frames shown]
[im 1/36]
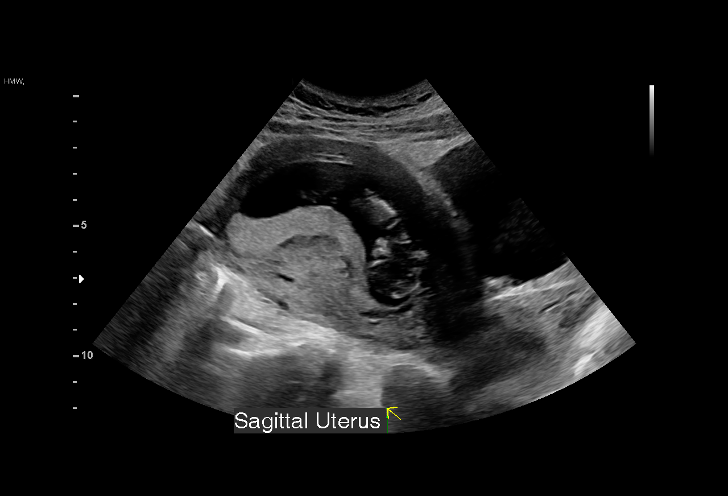
[im 3/36]
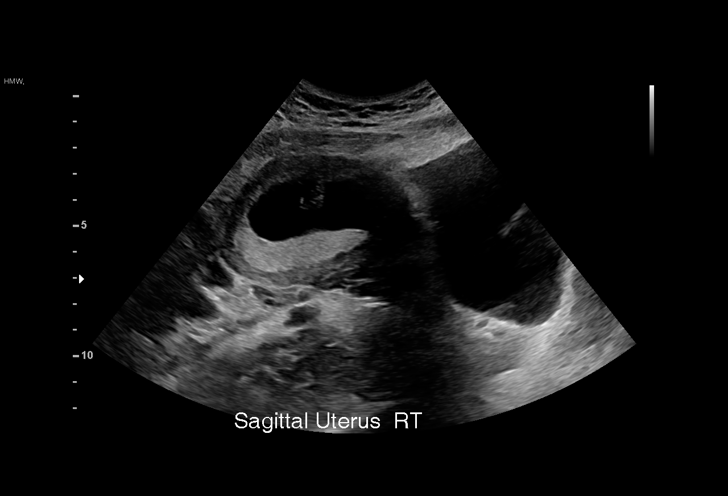
[im 6/36]
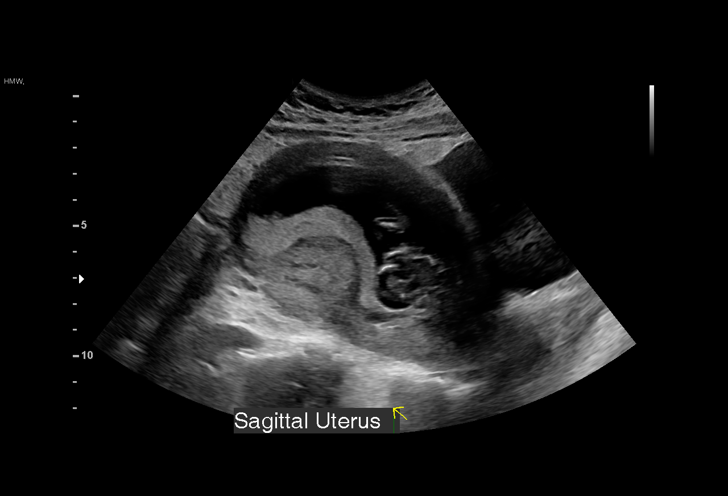
[im 8/36]
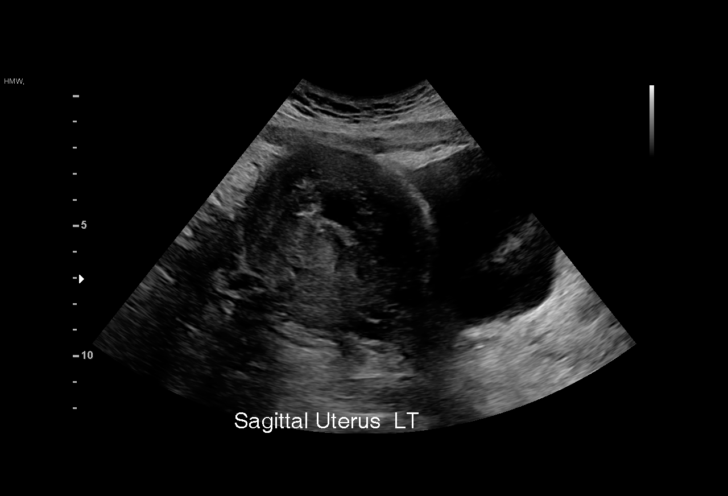
[im 11/36]
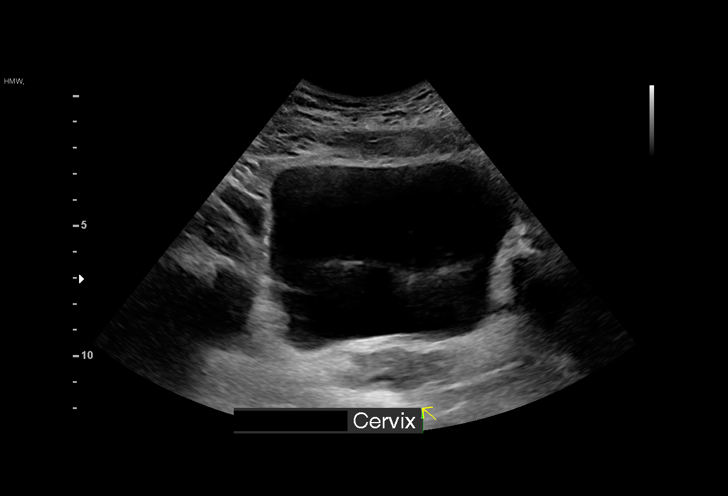
[im 13/36]
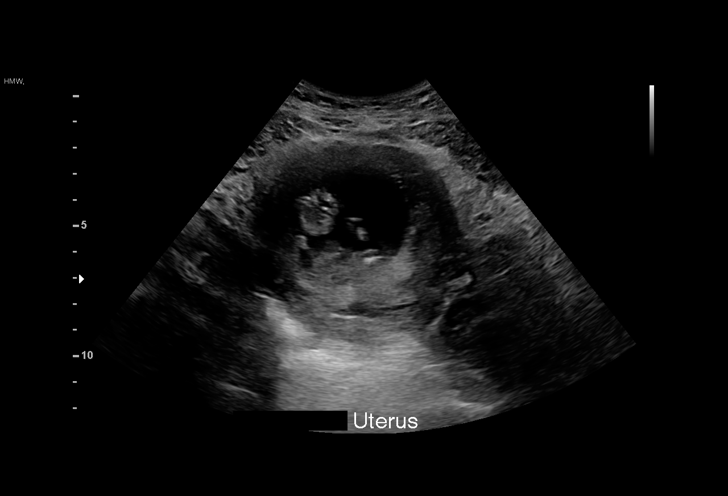
[im 16/36]
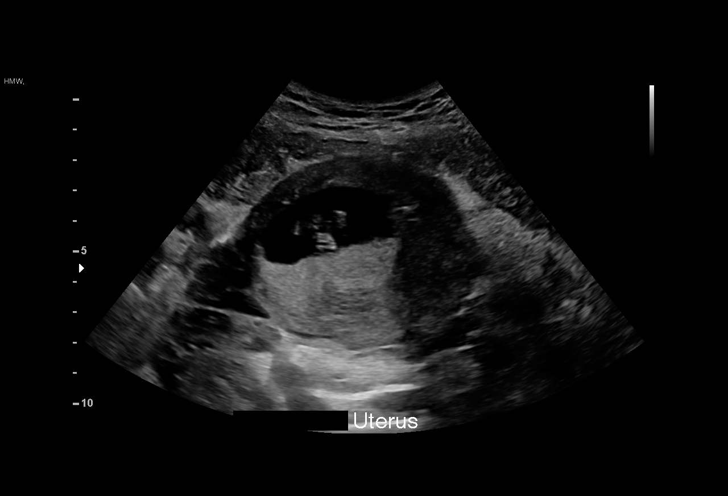
[im 19/36]
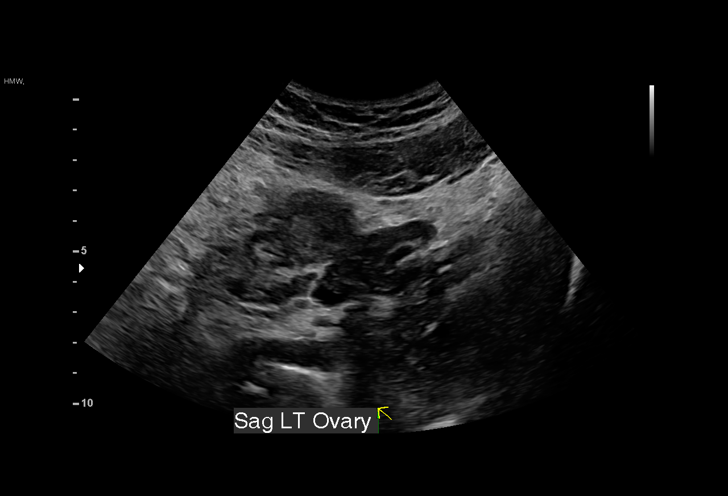
[im 20/36]
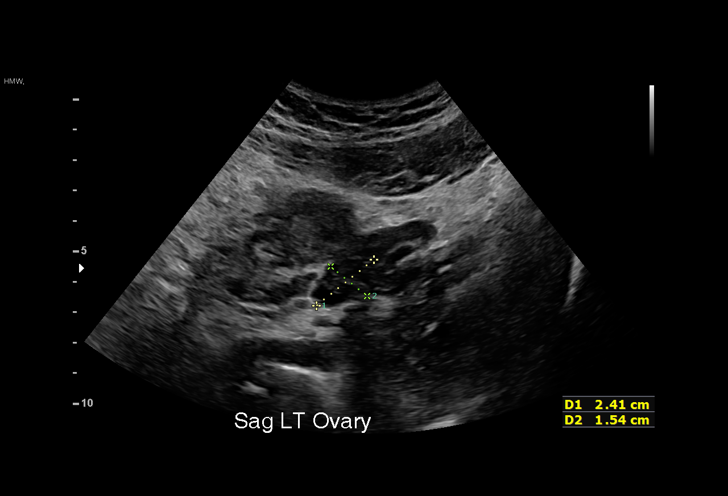
[im 23/36]
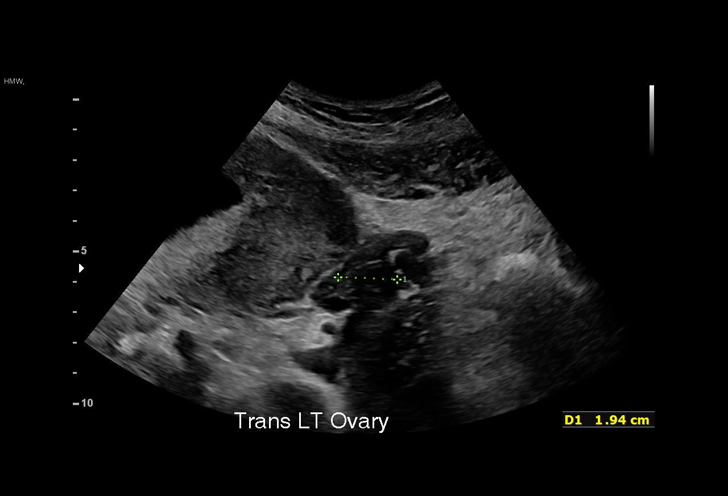
[im 25/36]
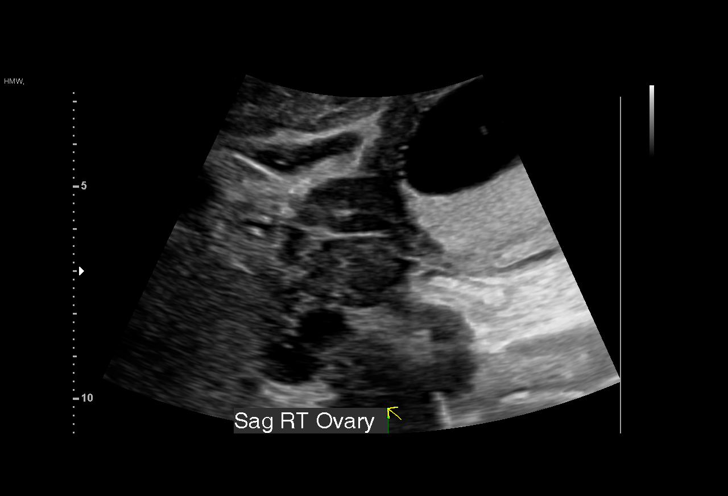
[im 28/36]
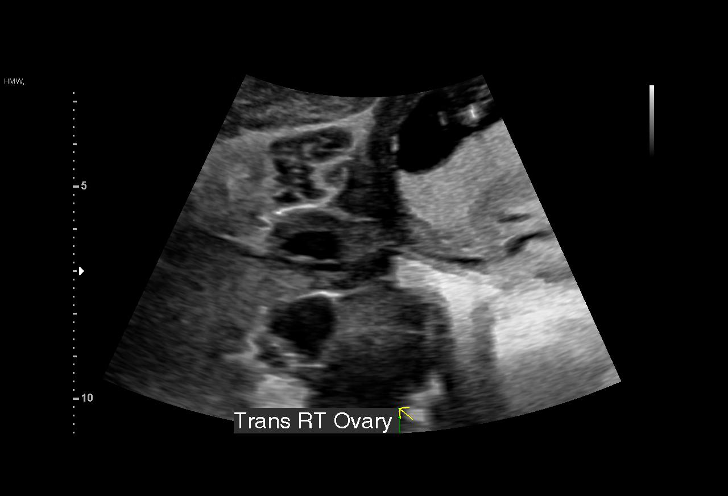
[im 30/36]
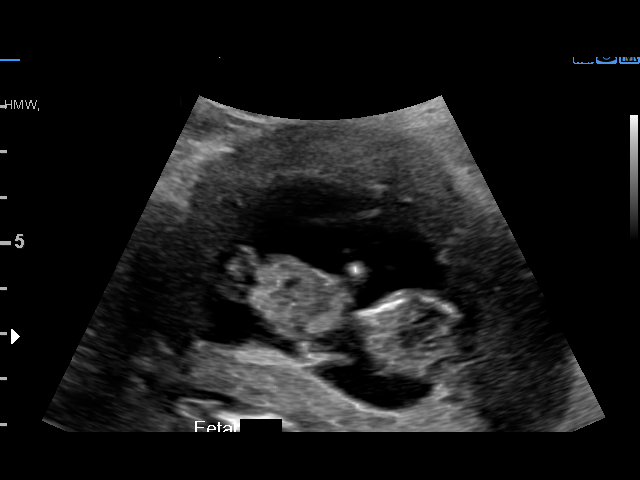
[im 33/36]
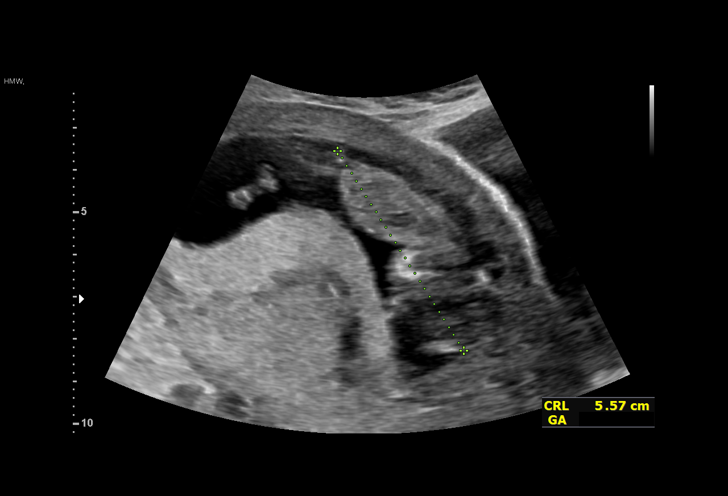
[im 36/36]
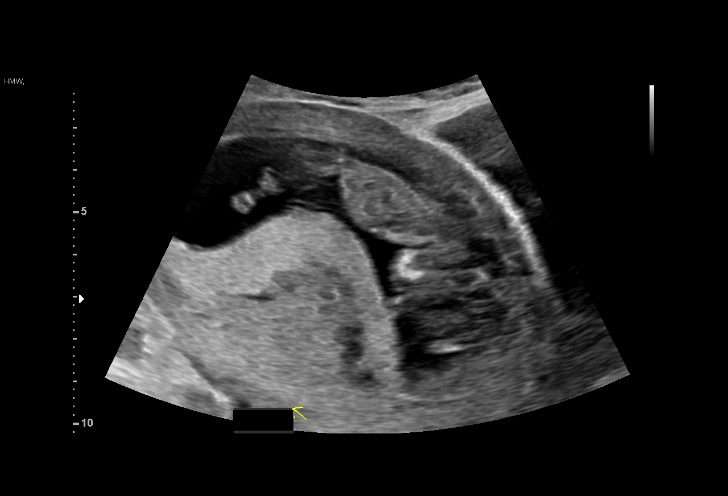

[15 of 28 positions shown; findings below may reference images not displayed]

FINDINGS: Intrauterine gestational sac: Present, single

Yolk sac:  Not identified

Embryo:  Present

Cardiac Activity: Present

Heart Rate: 169 bpm

CRL:   55.8 mm   12 w 1 d                  US EDC: 02/15/2022

Subchorionic hemorrhage:  None visualized.

Maternal uterus/adnexae:

Remainder of maternal uterus unremarkable.

Developing placenta is posterior in position.

RIGHT ovary measures 2.2 x 2.1 x 2.8 cm and contains a small corpus
luteal cyst.

LEFT ovary normal size and morphology, 1.9 x 2.4 x 1.5 cm.

No free pelvic fluid or adnexal masses.
IMPRESSION: Single live intrauterine gestation at 12 weeks 1 day EGA by
crown-rump length.

No acute abnormalities.

## 2023-05-29 DIAGNOSIS — Z30432 Encounter for removal of intrauterine contraceptive device: Secondary | ICD-10-CM | POA: Diagnosis not present

## 2023-07-12 DIAGNOSIS — Z124 Encounter for screening for malignant neoplasm of cervix: Secondary | ICD-10-CM | POA: Diagnosis not present

## 2023-07-12 DIAGNOSIS — Z01411 Encounter for gynecological examination (general) (routine) with abnormal findings: Secondary | ICD-10-CM | POA: Diagnosis not present

## 2023-07-12 DIAGNOSIS — Z0000 Encounter for general adult medical examination without abnormal findings: Secondary | ICD-10-CM | POA: Diagnosis not present

## 2023-07-12 DIAGNOSIS — Z1329 Encounter for screening for other suspected endocrine disorder: Secondary | ICD-10-CM | POA: Diagnosis not present

## 2023-07-12 DIAGNOSIS — Z01419 Encounter for gynecological examination (general) (routine) without abnormal findings: Secondary | ICD-10-CM | POA: Diagnosis not present

## 2023-07-12 DIAGNOSIS — Z113 Encounter for screening for infections with a predominantly sexual mode of transmission: Secondary | ICD-10-CM | POA: Diagnosis not present

## 2023-07-12 DIAGNOSIS — Z131 Encounter for screening for diabetes mellitus: Secondary | ICD-10-CM | POA: Diagnosis not present

## 2023-07-12 DIAGNOSIS — Z13220 Encounter for screening for lipoid disorders: Secondary | ICD-10-CM | POA: Diagnosis not present

## 2023-07-12 DIAGNOSIS — N711 Chronic inflammatory disease of uterus: Secondary | ICD-10-CM | POA: Diagnosis not present

## 2023-07-12 DIAGNOSIS — R109 Unspecified abdominal pain: Secondary | ICD-10-CM | POA: Diagnosis not present

## 2023-12-26 DIAGNOSIS — H52223 Regular astigmatism, bilateral: Secondary | ICD-10-CM | POA: Diagnosis not present

## 2024-03-06 DIAGNOSIS — N898 Other specified noninflammatory disorders of vagina: Secondary | ICD-10-CM | POA: Diagnosis not present

## 2024-03-06 DIAGNOSIS — N760 Acute vaginitis: Secondary | ICD-10-CM | POA: Diagnosis not present

## 2024-03-06 DIAGNOSIS — N390 Urinary tract infection, site not specified: Secondary | ICD-10-CM | POA: Diagnosis not present

## 2024-03-06 DIAGNOSIS — R300 Dysuria: Secondary | ICD-10-CM | POA: Diagnosis not present

## 2024-09-22 ENCOUNTER — Other Ambulatory Visit (HOSPITAL_BASED_OUTPATIENT_CLINIC_OR_DEPARTMENT_OTHER): Payer: Self-pay

## 2024-09-22 MED ORDER — FLUZONE 0.5 ML IM SUSY
0.5000 mL | PREFILLED_SYRINGE | Freq: Once | INTRAMUSCULAR | 0 refills | Status: AC
  Filled 2024-09-22: qty 0.5, 1d supply, fill #0
